# Patient Record
Sex: Male | Born: 1975 | Hispanic: No | Marital: Married | Smoking: Never smoker
Health system: Southern US, Community
[De-identification: ages and names within clinical notes are randomized; demographics above are authoritative.]

## PROBLEM LIST (undated history)

## (undated) DIAGNOSIS — F411 Generalized anxiety disorder: Secondary | ICD-10-CM

## (undated) DIAGNOSIS — K648 Other hemorrhoids: Secondary | ICD-10-CM

## (undated) DIAGNOSIS — M722 Plantar fascial fibromatosis: Secondary | ICD-10-CM

## (undated) DIAGNOSIS — K602 Anal fissure, unspecified: Secondary | ICD-10-CM

## (undated) DIAGNOSIS — R739 Hyperglycemia, unspecified: Secondary | ICD-10-CM

## (undated) DIAGNOSIS — K5080 Crohn's disease of both small and large intestine without complications: Secondary | ICD-10-CM

## (undated) DIAGNOSIS — K449 Diaphragmatic hernia without obstruction or gangrene: Secondary | ICD-10-CM

## (undated) DIAGNOSIS — K219 Gastro-esophageal reflux disease without esophagitis: Secondary | ICD-10-CM

## (undated) DIAGNOSIS — I509 Heart failure, unspecified: Secondary | ICD-10-CM

## (undated) DIAGNOSIS — K589 Irritable bowel syndrome without diarrhea: Secondary | ICD-10-CM

## (undated) DIAGNOSIS — E785 Hyperlipidemia, unspecified: Secondary | ICD-10-CM

## (undated) DIAGNOSIS — J309 Allergic rhinitis, unspecified: Secondary | ICD-10-CM

## (undated) HISTORY — PX: PILONIDAL CYST EXCISION: SHX744

## (undated) HISTORY — DX: Anal fissure, unspecified: K60.2

## (undated) HISTORY — DX: Gastro-esophageal reflux disease without esophagitis: K21.9

## (undated) HISTORY — DX: Plantar fascial fibromatosis: M72.2

## (undated) HISTORY — DX: Hyperlipidemia, unspecified: E78.5

## (undated) HISTORY — DX: Diaphragmatic hernia without obstruction or gangrene: K44.9

## (undated) HISTORY — DX: Allergic rhinitis, unspecified: J30.9

## (undated) HISTORY — DX: Crohn's disease of both small and large intestine without complications: K50.80

## (undated) HISTORY — DX: Hyperglycemia, unspecified: R73.9

## (undated) HISTORY — DX: Irritable bowel syndrome without diarrhea: K58.9

## (undated) HISTORY — DX: Generalized anxiety disorder: F41.1

## (undated) HISTORY — DX: Other hemorrhoids: K64.8

## (undated) HISTORY — DX: Heart failure, unspecified: I50.9

---

## 1997-09-04 DIAGNOSIS — K5080 Crohn's disease of both small and large intestine without complications: Secondary | ICD-10-CM

## 1997-09-04 HISTORY — DX: Crohn's disease of both small and large intestine without complications: K50.80

## 2000-11-16 ENCOUNTER — Encounter: Payer: Self-pay | Admitting: Gastroenterology

## 2000-11-16 ENCOUNTER — Other Ambulatory Visit: Admission: RE | Admit: 2000-11-16 | Discharge: 2000-11-16 | Payer: Self-pay | Admitting: Gastroenterology

## 2000-11-16 DIAGNOSIS — K648 Other hemorrhoids: Secondary | ICD-10-CM | POA: Insufficient documentation

## 2001-10-22 ENCOUNTER — Encounter: Payer: Self-pay | Admitting: Gastroenterology

## 2001-10-22 ENCOUNTER — Ambulatory Visit (HOSPITAL_COMMUNITY): Admission: RE | Admit: 2001-10-22 | Discharge: 2001-10-22 | Payer: Self-pay | Admitting: Gastroenterology

## 2001-10-24 ENCOUNTER — Encounter: Payer: Self-pay | Admitting: Gastroenterology

## 2001-10-24 DIAGNOSIS — K210 Gastro-esophageal reflux disease with esophagitis: Secondary | ICD-10-CM

## 2001-10-24 DIAGNOSIS — K2970 Gastritis, unspecified, without bleeding: Secondary | ICD-10-CM | POA: Insufficient documentation

## 2001-10-24 DIAGNOSIS — K449 Diaphragmatic hernia without obstruction or gangrene: Secondary | ICD-10-CM | POA: Insufficient documentation

## 2001-10-24 DIAGNOSIS — K2990 Gastroduodenitis, unspecified, without bleeding: Secondary | ICD-10-CM

## 2004-08-03 ENCOUNTER — Ambulatory Visit: Payer: Self-pay | Admitting: Internal Medicine

## 2004-12-26 ENCOUNTER — Ambulatory Visit: Payer: Self-pay | Admitting: Internal Medicine

## 2005-01-03 ENCOUNTER — Ambulatory Visit (HOSPITAL_COMMUNITY): Admission: RE | Admit: 2005-01-03 | Discharge: 2005-01-03 | Payer: Self-pay | Admitting: Internal Medicine

## 2005-03-08 ENCOUNTER — Ambulatory Visit: Payer: Self-pay | Admitting: Internal Medicine

## 2005-08-24 ENCOUNTER — Ambulatory Visit: Payer: Self-pay | Admitting: Internal Medicine

## 2005-10-11 ENCOUNTER — Ambulatory Visit: Payer: Self-pay | Admitting: Internal Medicine

## 2005-10-17 ENCOUNTER — Ambulatory Visit: Payer: Self-pay | Admitting: Internal Medicine

## 2005-11-18 ENCOUNTER — Ambulatory Visit: Payer: Self-pay | Admitting: Internal Medicine

## 2006-01-15 ENCOUNTER — Ambulatory Visit: Payer: Self-pay | Admitting: Internal Medicine

## 2006-01-30 ENCOUNTER — Ambulatory Visit: Payer: Self-pay | Admitting: Internal Medicine

## 2006-02-01 ENCOUNTER — Encounter (INDEPENDENT_AMBULATORY_CARE_PROVIDER_SITE_OTHER): Payer: Self-pay | Admitting: Specialist

## 2006-02-01 ENCOUNTER — Ambulatory Visit: Payer: Self-pay | Admitting: Internal Medicine

## 2006-03-19 ENCOUNTER — Ambulatory Visit: Payer: Self-pay | Admitting: Gastroenterology

## 2006-08-01 ENCOUNTER — Ambulatory Visit: Payer: Self-pay | Admitting: Internal Medicine

## 2006-11-26 ENCOUNTER — Ambulatory Visit: Payer: Self-pay | Admitting: Internal Medicine

## 2007-04-03 DIAGNOSIS — K589 Irritable bowel syndrome without diarrhea: Secondary | ICD-10-CM | POA: Insufficient documentation

## 2007-04-03 DIAGNOSIS — K5090 Crohn's disease, unspecified, without complications: Secondary | ICD-10-CM | POA: Insufficient documentation

## 2007-04-03 DIAGNOSIS — K219 Gastro-esophageal reflux disease without esophagitis: Secondary | ICD-10-CM

## 2007-04-03 HISTORY — DX: Gastro-esophageal reflux disease without esophagitis: K21.9

## 2007-04-03 HISTORY — DX: Irritable bowel syndrome, unspecified: K58.9

## 2007-04-05 ENCOUNTER — Ambulatory Visit: Payer: Self-pay | Admitting: Internal Medicine

## 2007-04-06 DIAGNOSIS — J309 Allergic rhinitis, unspecified: Secondary | ICD-10-CM

## 2007-04-06 HISTORY — DX: Allergic rhinitis, unspecified: J30.9

## 2007-04-16 ENCOUNTER — Ambulatory Visit: Payer: Self-pay | Admitting: Internal Medicine

## 2007-04-29 ENCOUNTER — Ambulatory Visit: Payer: Self-pay | Admitting: Internal Medicine

## 2007-05-03 ENCOUNTER — Ambulatory Visit (HOSPITAL_COMMUNITY): Admission: RE | Admit: 2007-05-03 | Discharge: 2007-05-03 | Payer: Self-pay | Admitting: Internal Medicine

## 2007-07-01 ENCOUNTER — Ambulatory Visit: Payer: Self-pay | Admitting: Gastroenterology

## 2007-07-04 DIAGNOSIS — R05 Cough: Secondary | ICD-10-CM

## 2007-07-04 DIAGNOSIS — IMO0002 Reserved for concepts with insufficient information to code with codable children: Secondary | ICD-10-CM | POA: Insufficient documentation

## 2007-07-04 DIAGNOSIS — M545 Low back pain: Secondary | ICD-10-CM

## 2007-08-05 ENCOUNTER — Encounter (INDEPENDENT_AMBULATORY_CARE_PROVIDER_SITE_OTHER): Payer: Self-pay | Admitting: *Deleted

## 2007-10-08 ENCOUNTER — Ambulatory Visit: Payer: Self-pay | Admitting: Internal Medicine

## 2007-10-08 DIAGNOSIS — F411 Generalized anxiety disorder: Secondary | ICD-10-CM

## 2007-10-08 HISTORY — DX: Generalized anxiety disorder: F41.1

## 2007-10-16 ENCOUNTER — Ambulatory Visit: Payer: Self-pay | Admitting: Internal Medicine

## 2007-10-16 ENCOUNTER — Telehealth (INDEPENDENT_AMBULATORY_CARE_PROVIDER_SITE_OTHER): Payer: Self-pay | Admitting: *Deleted

## 2007-10-28 ENCOUNTER — Telehealth (INDEPENDENT_AMBULATORY_CARE_PROVIDER_SITE_OTHER): Payer: Self-pay | Admitting: *Deleted

## 2007-10-31 ENCOUNTER — Encounter: Payer: Self-pay | Admitting: Internal Medicine

## 2007-11-06 ENCOUNTER — Ambulatory Visit: Payer: Self-pay | Admitting: Cardiology

## 2007-12-05 ENCOUNTER — Ambulatory Visit: Payer: Self-pay | Admitting: Internal Medicine

## 2007-12-06 ENCOUNTER — Ambulatory Visit: Payer: Self-pay | Admitting: Internal Medicine

## 2007-12-06 LAB — CONVERTED CEMR LAB
ALT: 46 units/L (ref 0–53)
AST: 27 units/L (ref 0–37)
Albumin: 3.8 g/dL (ref 3.5–5.2)
Alkaline Phosphatase: 72 units/L (ref 39–117)
BUN: 11 mg/dL (ref 6–23)
Bacteria, UA: NEGATIVE
Basophils Absolute: 0 10*3/uL (ref 0.0–0.1)
Basophils Relative: 0.4 % (ref 0.0–1.0)
Bilirubin Urine: NEGATIVE
Bilirubin, Direct: 0.2 mg/dL (ref 0.0–0.3)
CO2: 32 meq/L (ref 19–32)
Calcium: 9.1 mg/dL (ref 8.4–10.5)
Chloride: 107 meq/L (ref 96–112)
Cholesterol: 243 mg/dL (ref 0–200)
Creatinine, Ser: 1 mg/dL (ref 0.4–1.5)
Crystals: NEGATIVE
Direct LDL: 198.6 mg/dL
Eosinophils Absolute: 0.1 10*3/uL (ref 0.0–0.7)
Eosinophils Relative: 1.3 % (ref 0.0–5.0)
GFR calc Af Amer: 113 mL/min
GFR calc non Af Amer: 93 mL/min
Glucose, Bld: 110 mg/dL — ABNORMAL HIGH (ref 70–99)
HCT: 43.8 % (ref 39.0–52.0)
HDL: 34.1 mg/dL — ABNORMAL LOW (ref >39.0)
Hemoglobin, Urine: NEGATIVE
Hemoglobin: 14.4 g/dL (ref 13.0–17.0)
Ketones, ur: NEGATIVE mg/dL
Leukocytes, UA: NEGATIVE
Lymphocytes Relative: 30.8 % (ref 12.0–46.0)
MCHC: 32.8 g/dL (ref 30.0–36.0)
MCV: 80.9 fL (ref 78.0–100.0)
Monocytes Absolute: 0.8 10*3/uL (ref 0.1–1.0)
Monocytes Relative: 10.3 % (ref 3.0–12.0)
Neutro Abs: 4.2 10*3/uL (ref 1.4–7.7)
Neutrophils Relative %: 57.2 % (ref 43.0–77.0)
Nitrite: NEGATIVE
PSA: 1.6 ng/mL (ref 0.10–4.00)
Platelets: 240 10*3/uL (ref 150–400)
Potassium: 4.3 meq/L (ref 3.5–5.1)
RBC: 5.41 M/uL (ref 4.22–5.81)
RDW: 12.7 % (ref 11.5–14.6)
Sodium: 142 meq/L (ref 135–145)
Specific Gravity, Urine: 1.01 (ref 1.000–1.03)
Squamous Epithelial / HPF: NEGATIVE /lpf
TSH: 1.69 microintl units/mL (ref 0.35–5.50)
Total Bilirubin: 0.7 mg/dL (ref 0.3–1.2)
Total CHOL/HDL Ratio: 7.1
Total Protein, Urine: 30 mg/dL — AB
Total Protein: 7.4 g/dL (ref 6.0–8.3)
Triglycerides: 126 mg/dL (ref 0–149)
Urine Glucose: NEGATIVE mg/dL
Urobilinogen, UA: 0.2 (ref 0.0–1.0)
VLDL: 25 mg/dL (ref 0–40)
WBC, UA: NONE SEEN cells/hpf
WBC: 7.4 10*3/uL (ref 4.5–10.5)
pH: 7 (ref 5.0–8.0)

## 2008-01-10 ENCOUNTER — Telehealth: Payer: Self-pay | Admitting: Internal Medicine

## 2008-01-28 ENCOUNTER — Telehealth: Payer: Self-pay | Admitting: Internal Medicine

## 2008-04-09 ENCOUNTER — Ambulatory Visit: Payer: Self-pay | Admitting: Internal Medicine

## 2008-04-09 DIAGNOSIS — E785 Hyperlipidemia, unspecified: Secondary | ICD-10-CM

## 2008-04-09 HISTORY — DX: Hyperlipidemia, unspecified: E78.5

## 2008-04-10 LAB — CONVERTED CEMR LAB
ALT: 48 units/L (ref 0–53)
AST: 27 units/L (ref 0–37)
Albumin: 4.3 g/dL (ref 3.5–5.2)
Alkaline Phosphatase: 73 units/L (ref 39–117)
Bilirubin, Direct: 0.1 mg/dL (ref 0.0–0.3)
Cholesterol: 166 mg/dL (ref 0–200)
Direct LDL: 108.8 mg/dL
HDL: 35.4 mg/dL — ABNORMAL LOW (ref >39.0)
Total Bilirubin: 0.9 mg/dL (ref 0.3–1.2)
Total CHOL/HDL Ratio: 4.7
Total Protein: 8.2 g/dL (ref 6.0–8.3)
Triglycerides: 214 mg/dL (ref 0–149)
VLDL: 43 mg/dL — ABNORMAL HIGH (ref 0–40)

## 2008-10-12 ENCOUNTER — Ambulatory Visit: Payer: Self-pay | Admitting: Endocrinology

## 2009-01-07 ENCOUNTER — Ambulatory Visit: Payer: Self-pay | Admitting: Internal Medicine

## 2009-01-07 LAB — CONVERTED CEMR LAB
ALT: 31 units/L (ref 0–53)
AST: 27 units/L (ref 0–37)
Albumin: 4.1 g/dL (ref 3.5–5.2)
Alkaline Phosphatase: 80 units/L (ref 39–117)
BUN: 10 mg/dL (ref 6–23)
Basophils Absolute: 0.1 10*3/uL (ref 0.0–0.1)
Basophils Relative: 0.7 % (ref 0.0–3.0)
Bilirubin Urine: NEGATIVE
Bilirubin, Direct: 0.1 mg/dL (ref 0.0–0.3)
CO2: 33 meq/L — ABNORMAL HIGH (ref 19–32)
Calcium: 9.3 mg/dL (ref 8.4–10.5)
Chloride: 102 meq/L (ref 96–112)
Cholesterol: 222 mg/dL — ABNORMAL HIGH (ref 0–200)
Creatinine, Ser: 0.9 mg/dL (ref 0.4–1.5)
Direct LDL: 157 mg/dL
Eosinophils Absolute: 0.1 10*3/uL (ref 0.0–0.7)
Eosinophils Relative: 0.9 % (ref 0.0–5.0)
GFR calc non Af Amer: 104.08 mL/min (ref >60)
Glucose, Bld: 81 mg/dL (ref 70–99)
HCT: 43.2 % (ref 39.0–52.0)
HDL: 31.2 mg/dL — ABNORMAL LOW (ref >39.00)
Hemoglobin: 15 g/dL (ref 13.0–17.0)
Ketones, ur: NEGATIVE mg/dL
Leukocytes, UA: NEGATIVE
Lymphocytes Relative: 32.4 % (ref 12.0–46.0)
Lymphs Abs: 2.5 10*3/uL (ref 0.7–4.0)
MCHC: 34.6 g/dL (ref 30.0–36.0)
MCV: 82.8 fL (ref 78.0–100.0)
Monocytes Absolute: 0.5 10*3/uL (ref 0.1–1.0)
Monocytes Relative: 6.8 % (ref 3.0–12.0)
Neutro Abs: 4.4 10*3/uL (ref 1.4–7.7)
Neutrophils Relative %: 59.2 % (ref 43.0–77.0)
Nitrite: NEGATIVE
Platelets: 237 10*3/uL (ref 150.0–400.0)
Potassium: 4.1 meq/L (ref 3.5–5.1)
RBC: 5.22 M/uL (ref 4.22–5.81)
RDW: 12.1 % (ref 11.5–14.6)
Sodium: 141 meq/L (ref 135–145)
Specific Gravity, Urine: 1.01 (ref 1.000–1.030)
TSH: 1.28 microintl units/mL (ref 0.35–5.50)
Total Bilirubin: 0.8 mg/dL (ref 0.3–1.2)
Total CHOL/HDL Ratio: 7
Total Protein, Urine: NEGATIVE mg/dL
Total Protein: 7.8 g/dL (ref 6.0–8.3)
Triglycerides: 285 mg/dL — ABNORMAL HIGH (ref 0.0–149.0)
Urine Glucose: NEGATIVE mg/dL
Urobilinogen, UA: 0.2 (ref 0.0–1.0)
VLDL: 57 mg/dL — ABNORMAL HIGH (ref 0.0–40.0)
WBC: 7.6 10*3/uL (ref 4.5–10.5)
pH: 5.5 (ref 5.0–8.0)

## 2009-02-11 ENCOUNTER — Ambulatory Visit: Payer: Self-pay | Admitting: Internal Medicine

## 2009-02-11 ENCOUNTER — Telehealth: Payer: Self-pay | Admitting: Internal Medicine

## 2009-02-11 DIAGNOSIS — S43006A Unspecified dislocation of unspecified shoulder joint, initial encounter: Secondary | ICD-10-CM | POA: Insufficient documentation

## 2009-02-11 DIAGNOSIS — E8881 Metabolic syndrome: Secondary | ICD-10-CM | POA: Insufficient documentation

## 2009-02-11 LAB — CONVERTED CEMR LAB
Cholesterol, target level: 200 mg/dL
HDL goal, serum: 40 mg/dL
LDL Goal: 160 mg/dL

## 2009-07-16 ENCOUNTER — Ambulatory Visit: Payer: Self-pay | Admitting: Internal Medicine

## 2009-07-16 DIAGNOSIS — K921 Melena: Secondary | ICD-10-CM | POA: Insufficient documentation

## 2009-07-27 ENCOUNTER — Encounter (INDEPENDENT_AMBULATORY_CARE_PROVIDER_SITE_OTHER): Payer: Self-pay | Admitting: *Deleted

## 2009-07-27 ENCOUNTER — Ambulatory Visit: Payer: Self-pay | Admitting: Gastroenterology

## 2009-07-27 DIAGNOSIS — K6289 Other specified diseases of anus and rectum: Secondary | ICD-10-CM

## 2009-07-27 DIAGNOSIS — K602 Anal fissure, unspecified: Secondary | ICD-10-CM | POA: Insufficient documentation

## 2009-08-25 ENCOUNTER — Ambulatory Visit: Payer: Self-pay | Admitting: Gastroenterology

## 2009-08-25 LAB — HM COLONOSCOPY

## 2009-08-26 ENCOUNTER — Ambulatory Visit: Payer: Self-pay | Admitting: Internal Medicine

## 2009-08-26 ENCOUNTER — Encounter: Payer: Self-pay | Admitting: Gastroenterology

## 2009-08-26 DIAGNOSIS — H109 Unspecified conjunctivitis: Secondary | ICD-10-CM | POA: Insufficient documentation

## 2009-08-26 DIAGNOSIS — M549 Dorsalgia, unspecified: Secondary | ICD-10-CM | POA: Insufficient documentation

## 2009-10-01 ENCOUNTER — Ambulatory Visit: Payer: Self-pay | Admitting: Internal Medicine

## 2009-10-14 ENCOUNTER — Ambulatory Visit: Payer: Self-pay | Admitting: Internal Medicine

## 2009-10-19 ENCOUNTER — Ambulatory Visit: Payer: Self-pay | Admitting: Gastroenterology

## 2009-10-19 DIAGNOSIS — K5080 Crohn's disease of both small and large intestine without complications: Secondary | ICD-10-CM | POA: Insufficient documentation

## 2009-11-13 IMAGING — CT CT PARANASAL SINUSES LIMITED
1 of 2 series · 16 of 25 positions shown, 20 images · non-contrast
Comparison: No priors for comparison.

CLINICAL DATA: 31-year-old with chronic cough and sore throat. 
LIMITED CT OF PARANASAL SINUSES:
TECHNIQUE: Limited coronal CT images were obtained through the paranasal sinuses without intravenous contrast.

[Series 4: ltd sinus 3.0 h30s · axial · 0.29mm/px · z∈[-104,-6]mm · 16 of 24 slices shown, 20 images]
[im 2/24  brain]
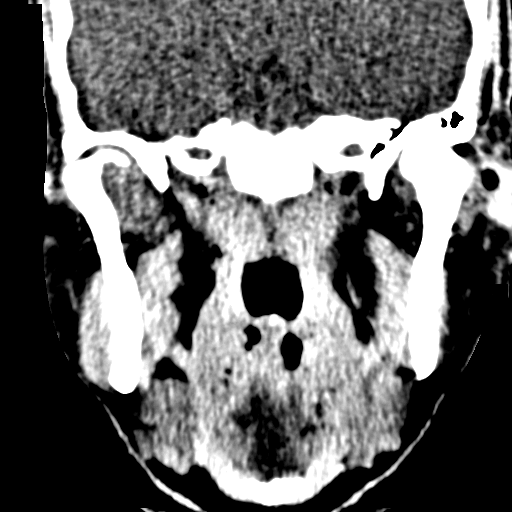
[im 2/24  bone]
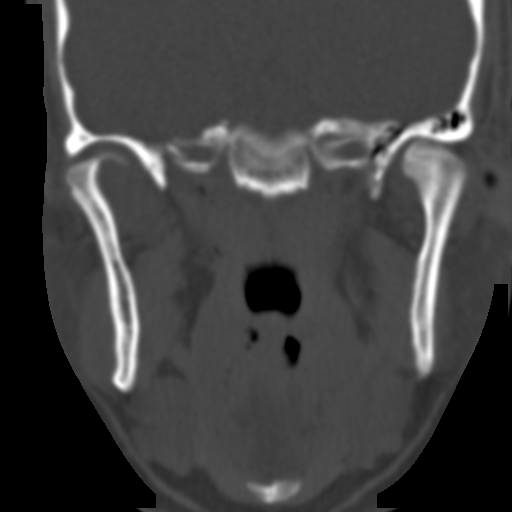
[im 4/24  bone]
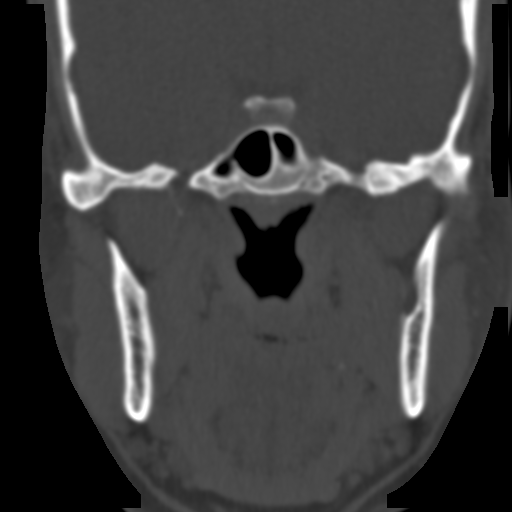
[im 5/24  bone]
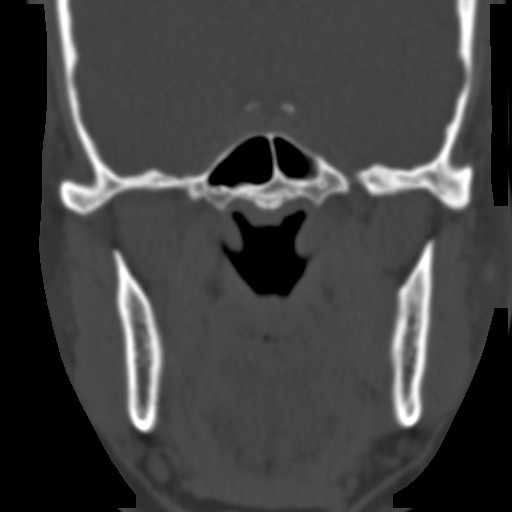
[im 6/24  bone]
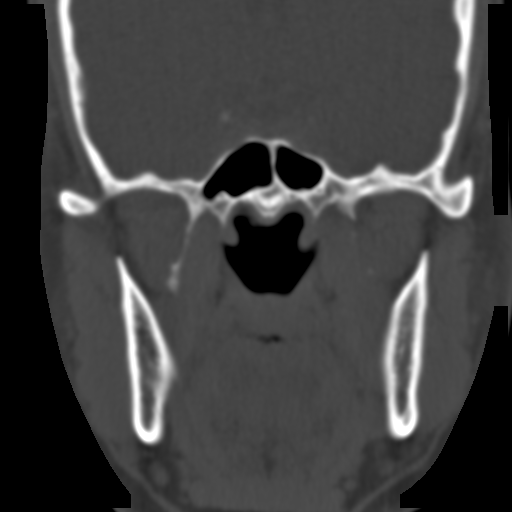
[im 8/24  brain]
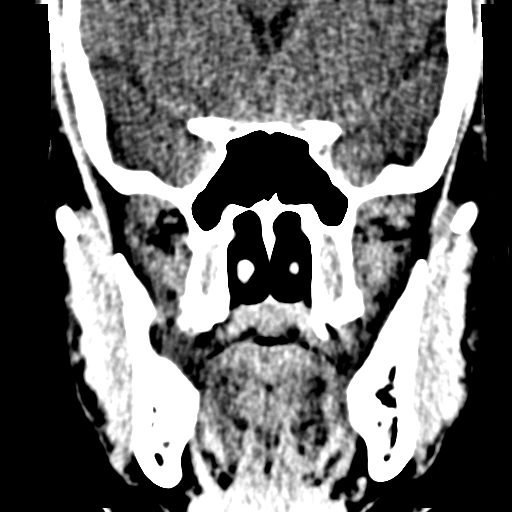
[im 8/24  bone]
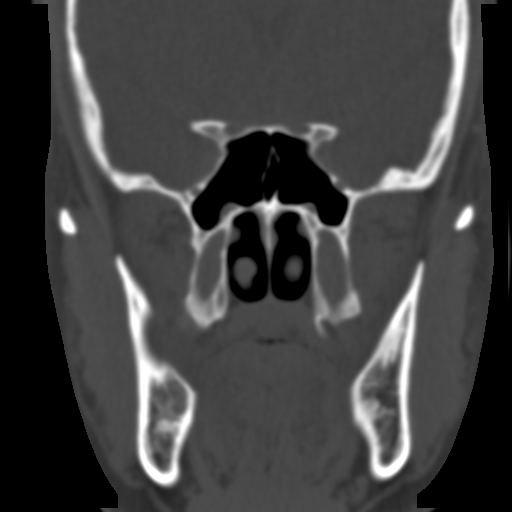
[im 9/24  bone]
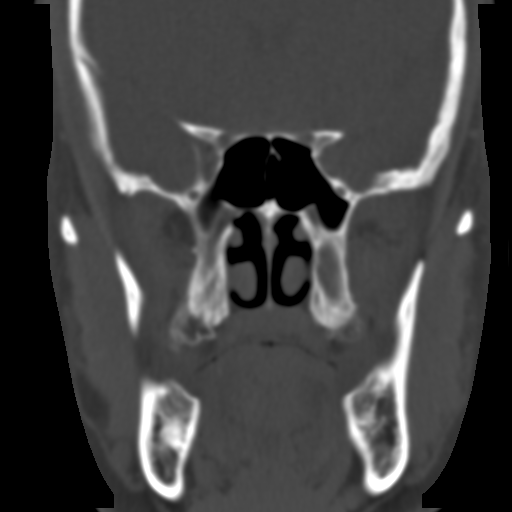
[im 10/24  bone]
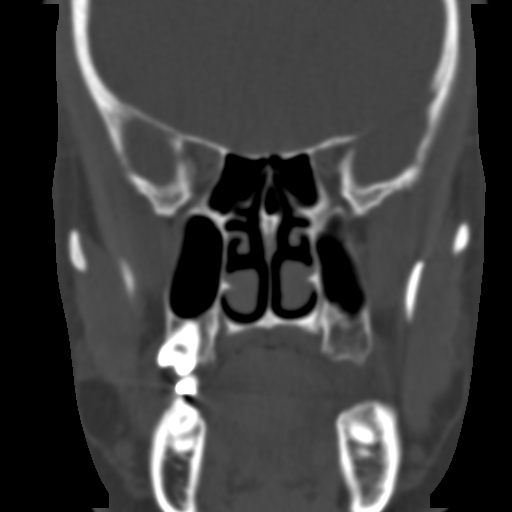
[im 12/24  bone]
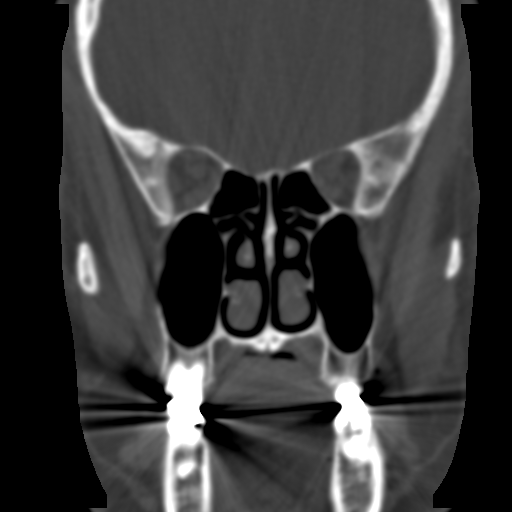
[im 13/24  brain]
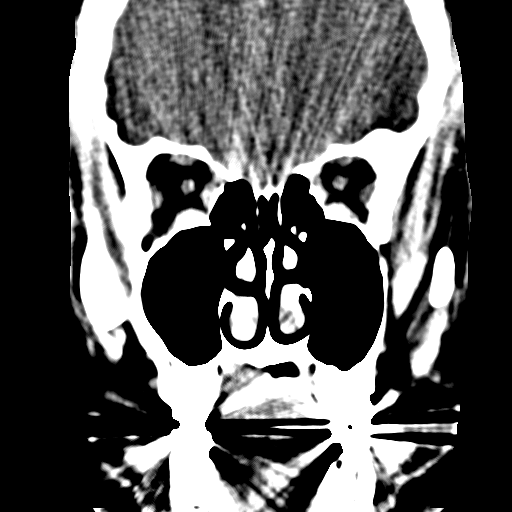
[im 13/24  bone]
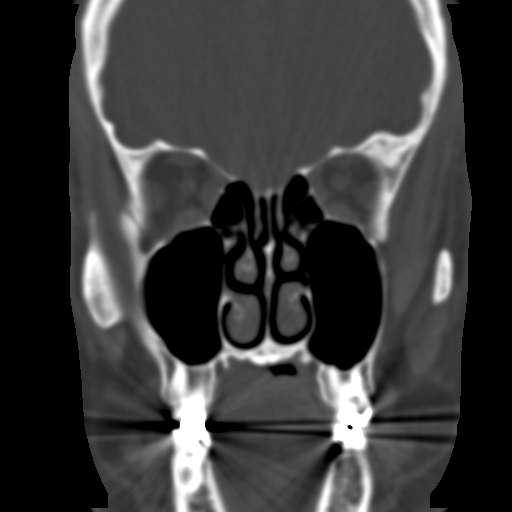
[im 15/24  bone]
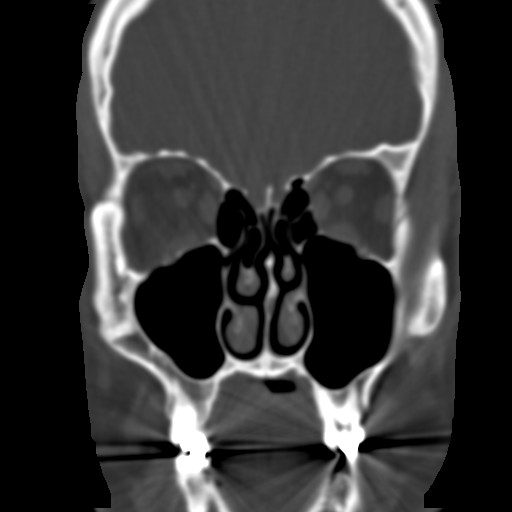
[im 16/24  bone]
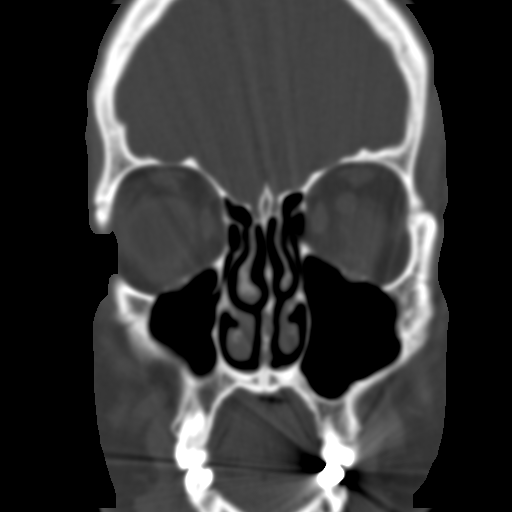
[im 17/24  bone]
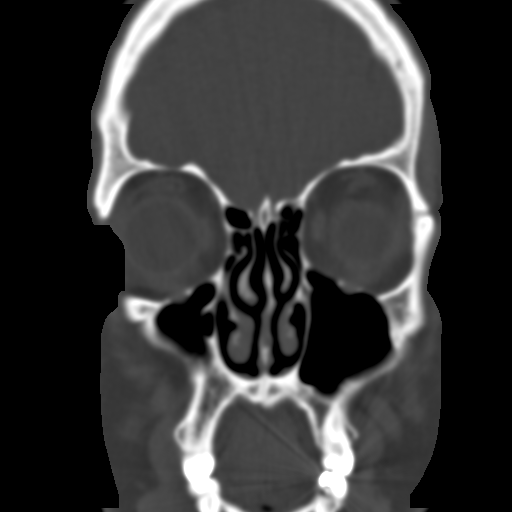
[im 19/24  brain]
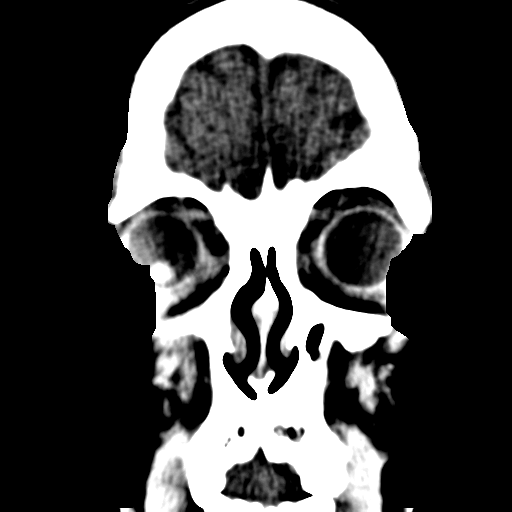
[im 19/24  bone]
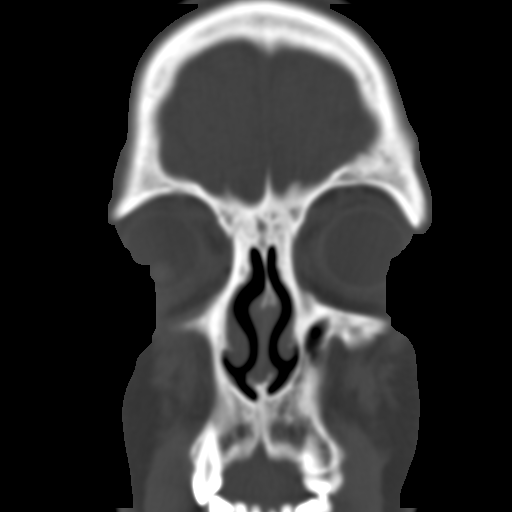
[im 20/24  bone]
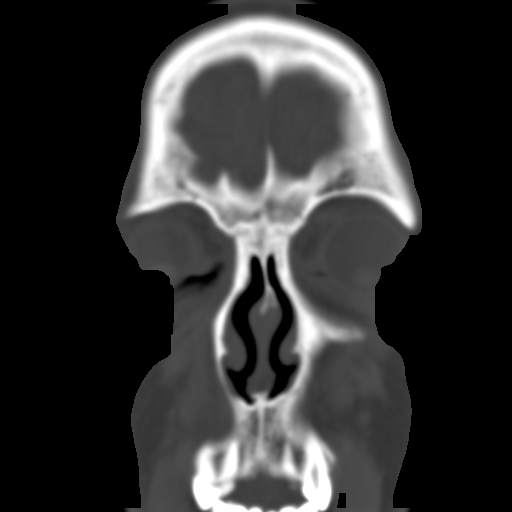
[im 21/24  bone]
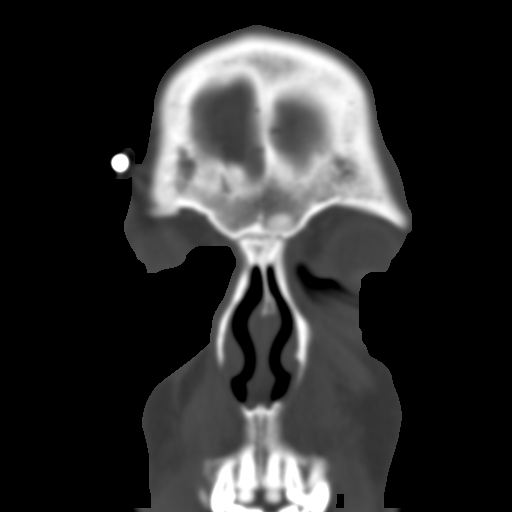
[im 23/24  bone]
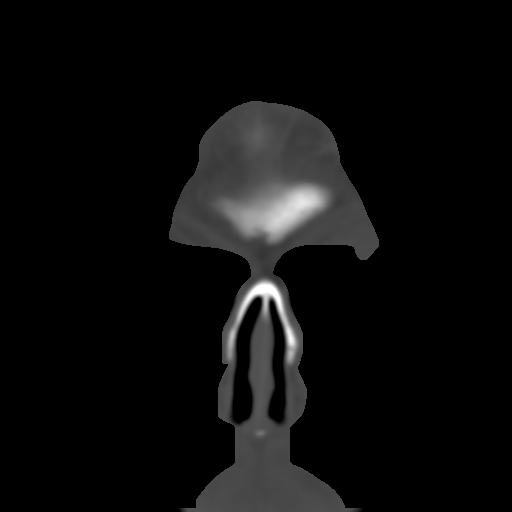

[16 of 25 positions shown; findings below may reference images not displayed]

FINDINGS: The sphenoid, maxillary, and ethmoid sinuses are clear.  The ostiomeatal complex is patent bilaterally.  The frontal sinuses are not developed in this patient, a congenital variant.  There are no findings to suggest acute or chronic sinusitis.  The orbital soft tissues are unremarkable on this noncontrast study.
IMPRESSION: Negative for acute or chronic sinusitis.

## 2010-01-20 ENCOUNTER — Ambulatory Visit: Payer: Self-pay | Admitting: Internal Medicine

## 2010-01-20 LAB — CONVERTED CEMR LAB
ALT: 24 units/L (ref 0–53)
AST: 18 units/L (ref 0–37)
Albumin: 4 g/dL (ref 3.5–5.2)
Alkaline Phosphatase: 73 units/L (ref 39–117)
BUN: 11 mg/dL (ref 6–23)
Basophils Absolute: 0 10*3/uL (ref 0.0–0.1)
Basophils Relative: 0.3 % (ref 0.0–3.0)
Bilirubin Urine: NEGATIVE
Bilirubin, Direct: 0.1 mg/dL (ref 0.0–0.3)
CO2: 30 meq/L (ref 19–32)
Calcium: 9.2 mg/dL (ref 8.4–10.5)
Chloride: 104 meq/L (ref 96–112)
Cholesterol: 162 mg/dL (ref 0–200)
Creatinine, Ser: 0.9 mg/dL (ref 0.4–1.5)
Eosinophils Absolute: 0.1 10*3/uL (ref 0.0–0.7)
Eosinophils Relative: 1.3 % (ref 0.0–5.0)
GFR calc non Af Amer: 103.4 mL/min (ref >60)
Glucose, Bld: 94 mg/dL (ref 70–99)
HCT: 42.3 % (ref 39.0–52.0)
HDL: 37.1 mg/dL — ABNORMAL LOW (ref >39.00)
Hemoglobin: 14.7 g/dL (ref 13.0–17.0)
Ketones, ur: NEGATIVE mg/dL
LDL Cholesterol: 92 mg/dL (ref 0–99)
Leukocytes, UA: NEGATIVE
Lymphocytes Relative: 25.4 % (ref 12.0–46.0)
Lymphs Abs: 2.7 10*3/uL (ref 0.7–4.0)
MCHC: 34.7 g/dL (ref 30.0–36.0)
MCV: 81.9 fL (ref 78.0–100.0)
Monocytes Absolute: 0.6 10*3/uL (ref 0.1–1.0)
Monocytes Relative: 5.4 % (ref 3.0–12.0)
Neutro Abs: 7.3 10*3/uL (ref 1.4–7.7)
Neutrophils Relative %: 67.6 % (ref 43.0–77.0)
Nitrite: NEGATIVE
Platelets: 286 10*3/uL (ref 150.0–400.0)
Potassium: 4.4 meq/L (ref 3.5–5.1)
RBC: 5.17 M/uL (ref 4.22–5.81)
RDW: 13.7 % (ref 11.5–14.6)
Sodium: 140 meq/L (ref 135–145)
Specific Gravity, Urine: 1.02 (ref 1.000–1.030)
TSH: 1.29 microintl units/mL (ref 0.35–5.50)
Total Bilirubin: 0.6 mg/dL (ref 0.3–1.2)
Total CHOL/HDL Ratio: 4
Total Protein, Urine: NEGATIVE mg/dL
Total Protein: 7.5 g/dL (ref 6.0–8.3)
Triglycerides: 166 mg/dL — ABNORMAL HIGH (ref 0.0–149.0)
Urine Glucose: NEGATIVE mg/dL
Urobilinogen, UA: 0.2 (ref 0.0–1.0)
VLDL: 33.2 mg/dL (ref 0.0–40.0)
WBC: 10.8 10*3/uL — ABNORMAL HIGH (ref 4.5–10.5)
pH: 6 (ref 5.0–8.0)

## 2010-01-25 ENCOUNTER — Telehealth: Payer: Self-pay | Admitting: Internal Medicine

## 2010-05-12 ENCOUNTER — Ambulatory Visit: Payer: Self-pay | Admitting: Internal Medicine

## 2010-05-12 DIAGNOSIS — M722 Plantar fascial fibromatosis: Secondary | ICD-10-CM

## 2010-05-13 LAB — CONVERTED CEMR LAB
Direct LDL: 175.9 mg/dL
HDL: 34.6 mg/dL — ABNORMAL LOW (ref >39.00)
Triglycerides: 455 mg/dL — ABNORMAL HIGH (ref 0.0–149.0)

## 2010-06-09 ENCOUNTER — Telehealth: Payer: Self-pay | Admitting: Internal Medicine

## 2010-10-04 NOTE — Assessment & Plan Note (Signed)
Summary: CPX/ NWS #   Vital Signs:  Patient profile:   35 year old male Height:      69 inches Weight:      200.38 pounds BMI:     29.70 O2 Sat:      97 % on Room air Temp:     98.3 degrees F oral Pulse rate:   87 / minute BP sitting:   100 / 72  (left arm) Cuff size:   large  Vitals Entered ByZella Ball Ewing (Jan 20, 2010 8:41 AM)  O2 Flow:  Room air  CC: Adult Physical/RE   Primary Care Provider:  Oliver Barre, MD  CC:  Adult Physical/RE.  History of Present Illness: trying to cut back on red meat;  nsaid works well for the  back pain,  does not need the methocarbamol;  overall good complliance with emds;  right shoulder displacement from one yr ago improved - saw ortho, then PT now better but only 95% but not activity limiting;  Pt denies CP, sob, doe, wheezing, orthopnea, pnd, worsening LE edema, palps, dizziness or syncope  Pt denies new neuro symptoms such as headache, facial or extremity weakness   Problems Prior to Update: 1)  Crohn's Disease-large & Small Intestine  (ICD-555.2) 2)  Back Pain  (ICD-724.5) 3)  Conjunctivitis, Bilateral  (ICD-372.30) 4)  Anal Fissure  (ICD-565.0) 5)  Anal or Rectal Pain  (ICD-569.42) 6)  Hematochezia  (ICD-578.1) 7)  Dysmetabolic Syndrome  (ICD-277.7) 8)  Shoulder Dislocation  (ICD-831.00) 9)  Preventive Health Care  (ICD-V70.0) 10)  Hepatotoxicity, Drug-induced, Risk of  (ICD-V58.69) 11)  Hyperlipidemia  (ICD-272.4) 12)  Gerd  (ICD-530.81) 13)  Preventive Health Care  (ICD-V70.0) 14)  Internal Hemorrhoids  (ICD-455.0) 15)  Esophagitis, Reflux  (ICD-530.11) 16)  Hiatal Hernia  (ICD-553.3) 17)  Gastritis  (ICD-535.50) 18)  Anxiety  (ICD-300.00) 19)  Low Back Pain  (ICD-724.2) 20)  Herniated Disc  (ICD-722.2) 21)  Hx of Low Back Pain, Chronic  (ICD-724.2) 22)  Cough, Chronic  (ICD-786.2) 23)  Ibs  (ICD-564.1) 24)  Crohn's Disease  (ICD-555.9) 25)  Allergic Rhinitis  (ICD-477.9) 26)  Gerd  (ICD-530.81)  Medications Prior to  Update: 1)  Zyrtec Allergy 10 Mg Tabs (Cetirizine Hcl) .... Take 1 Tablet By Mouth Once A Day As Needed 2)  Simvastatin 40 Mg Tabs (Simvastatin) .... Once Daily 3)  Entocort Ec 3 Mg  Cp24 (Budesonide) .... Take 3 Capsules Daily 4)  Clarithromycin 500 Mg Tabs (Clarithromycin) .Marland Kitchen.. 1 By Mouth Two Times A Day 5)  Methocarbamol 750 Mg Tabs (Methocarbamol) .Marland Kitchen.. 1 By Mouth Two Times A Day As Needed 6)  Diclofenac Sodium 75 Mg Tbec (Diclofenac Sodium) .Marland Kitchen.. 1 By Mouth Two Times A Day As Needed Pain 7)  Tussionex Pennkinetic Er 8-10 Mg/69ml Lqcr (Chlorpheniramine-Hydrocodone) .Marland Kitchen.. 1 Tsp By Mouth Two Times A Day As Needed 8)  Proair Hfa 108 (90 Base) Mcg/act Aers (Albuterol Sulfate) .... 2 Puffs Four Times Per Day As Needed 9)  Protonix 40 Mg Tbec (Pantoprazole Sodium) .... One Tablet By Mouth Once Daily  Current Medications (verified): 1)  Zyrtec Allergy 10 Mg Tabs (Cetirizine Hcl) .... Take 1 Tablet By Mouth Once A Day As Needed 2)  Simvastatin 40 Mg Tabs (Simvastatin) .... Once Daily 3)  Entocort Ec 3 Mg  Cp24 (Budesonide) .... Take 3 Capsules Daily "as Needed" 4)  Diclofenac Sodium 75 Mg Tbec (Diclofenac Sodium) .Marland Kitchen.. 1 By Mouth Two Times A Day As Needed Pain 5)  Protonix 40 Mg Tbec (Pantoprazole Sodium) .... One Tablet By Mouth Once Daily  Allergies (verified): 1)  ! Advil 2)  * Advair  Past History:  Past Medical History: Last updated: 10/19/2009 GERD with LPR Crohns ileocolitis, Dx in 2002 Allergic rhinitis IBS Low back pain chronic cough Anxiety Hyperlipidemia  Past Surgical History: Last updated: 10/08/2007 Denies surgical history  Family History: Last updated: 07/27/2009 father with heart disease No FH of Colon Cancer:  Social History: Last updated: 02/11/2009 Former Smoker quit over 6 mo, minimum now Alcohol use-no Married 1 child Drug use-no Regular exercise-yes  Risk Factors: Exercise: yes (02/11/2009)  Risk Factors: Smoking Status: quit  (10/08/2007)  Family History: Reviewed history from 07/27/2009 and no changes required. father with heart disease No FH of Colon Cancer:  Social History: Reviewed history from 02/11/2009 and no changes required. Former Smoker quit over 6 mo, minimum now Alcohol use-no Married 1 child Drug use-no Regular exercise-yes  Review of Systems  The patient denies anorexia, fever, vision loss, decreased hearing, hoarseness, chest pain, syncope, dyspnea on exertion, peripheral edema, prolonged cough, headaches, hemoptysis, abdominal pain, melena, hematochezia, severe indigestion/heartburn, hematuria, muscle weakness, suspicious skin lesions, transient blindness, difficulty walking, depression, unusual weight change, abnormal bleeding, enlarged lymph nodes, angioedema, and testicular masses.         all otherwise negative per pt -    Physical Exam  General:  alert and overweight-appearing.   Head:  normocephalic and atraumatic.   Eyes:  vision grossly intact, pupils equal, and pupils round.   Ears:  R ear normal and L ear normal.   Nose:  no external deformity and no nasal discharge.   Mouth:  no gingival abnormalities and pharynx pink and moist.   Neck:  supple and no masses.   Lungs:  normal respiratory effort and normal breath sounds.   Heart:  normal rate and regular rhythm.   Abdomen:  soft, non-tender, and normal bowel sounds.   Msk:  no joint tenderness and no joint swelling.   Extremities:  no edema, no erythema  Neurologic:  cranial nerves II-XII intact and strength normal in all extremities.     Impression & Recommendations:  Problem # 1:  Preventive Health Care (ICD-V70.0) Overall doing well, age appropriate education and counseling updated and referral for appropriate preventive services done unless declined, immunizations up to date or declined, diet counseling done if overweight, urged to quit smoking if smokes , most recent labs reviewed and current ordered if  appropriate, ecg reviewed or declined (interpretation per ECG scanned in the EMR if done); information regarding Medicare Prevention requirements given if appropriate; speciality referrals updated as appropriate   Complete Medication List: 1)  Zyrtec Allergy 10 Mg Tabs (Cetirizine hcl) .... Take 1 tablet by mouth once a day as needed 2)  Simvastatin 40 Mg Tabs (Simvastatin) .... Once daily 3)  Entocort Ec 3 Mg Cp24 (Budesonide) .... Take 3 capsules daily "as needed" 4)  Diclofenac Sodium 75 Mg Tbec (Diclofenac sodium) .Marland Kitchen.. 1 by mouth two times a day as needed pain 5)  Protonix 40 Mg Tbec (Pantoprazole sodium) .... One tablet by mouth once daily  Patient Instructions: 1)  Continue all previous medications as before this visit  2)  please call the number on the blue card for the blood work results 3)  Please schedule a follow-up appointment in 1 year or sooner if needed

## 2010-10-04 NOTE — Progress Notes (Signed)
  Phone Note Other Incoming   Caller: pt  Summary of Call: pt wants to know if he should still be taking his simvastatin. Since his last blood work was all good. Pt states he would like to try and control cholesterol with diet and excercise. What do you suggest ? Initial call taken by: Ami Bullins CMA,  Jan 25, 2010 11:41 AM  Follow-up for Phone Call        it is ok to try, but I think in his case it may go back up;  if he wants to try, he can hold the simvastatin, and re-check Lipids only in 4 wks  - 272.0 Follow-up by: Corwin Levins MD,  Jan 25, 2010 2:06 PM  Additional Follow-up for Phone Call Additional follow up Details #1::        LABS SCHEDULED 02/21/2010,  left message on machine for pt to return my call. Margaret Pyle, CMA  Jan 25, 2010 2:41 PM  left message on machine for pt to return my call     Additional Follow-up for Phone Call Additional follow up Details #2::    left message on machine for pt to return my call, closing phone note until further contact from pt Follow-up by: Margaret Pyle, CMA,  Jan 26, 2010 3:45 PM

## 2010-10-04 NOTE — Assessment & Plan Note (Signed)
Summary: sore throat/#/cd   Vital Signs:  Patient profile:   35 year old male Height:      69 inches (175.26 cm) Weight:      196.50 pounds (89.32 kg) BMI:     29.12 O2 Sat:      97 % on Room air Temp:     97.6 degrees F (36.44 degrees C) oral Pulse rate:   93 / minute BP sitting:   102 / 78  (left arm) Cuff size:   large  Vitals Entered ByZella Ball Ewing (October 14, 2009 3:36 PM)  O2 Flow:  Room air CC: pt here to follow up with sore throat/finished levaquin with no improvement/re   Primary Care Provider:  Oliver Barre, MD  CC:  pt here to follow up with sore throat/finished levaquin with no improvement/re.  History of Present Illness: here with severe ST , better then worse again since last visit, and not better with prednisone course rx by am MD assoc with his work;  has some ant neck soreness and low grade temp, but not assoc with significant worsening sinus or ear pain, other neck stiffness, and Pt denies CP, sob, doe, wheezing, orthopnea, pnd, worsening LE edema, palps, dizziness or syncope .  No one else sick at home, but has been working overtime on several work projects (just finished today finally), with increased fatigue and exhaustion, and mult sick persons to whom he was exposed with URI type illness.  Has marked cough at night that seems to cause fitful and unrefreshing sleep.  No OSA symtpoms.   Denies worsening GI symtpoms such as incresaed pain or bowel frequency.  s/p colonoscopy 08/2009 as per EMR.  Denies worsening nasal allergy symtpoms such as congestion or post nasal gtt and no improvement with prednisone..  Problems Prior to Update: 1)  Pharyngitis-acute  (ICD-462) 2)  Back Pain  (ICD-724.5) 3)  Conjunctivitis, Bilateral  (ICD-372.30) 4)  Anal Fissure  (ICD-565.0) 5)  Anal or Rectal Pain  (ICD-569.42) 6)  Hematochezia  (ICD-578.1) 7)  Dysmetabolic Syndrome  (ICD-277.7) 8)  Shoulder Dislocation  (ICD-831.00) 9)  Preventive Health Care  (ICD-V70.0) 10)   Hepatotoxicity, Drug-induced, Risk of  (ICD-V58.69) 11)  Hyperlipidemia  (ICD-272.4) 12)  Gerd  (ICD-530.81) 13)  Preventive Health Care  (ICD-V70.0) 14)  Internal Hemorrhoids  (ICD-455.0) 15)  Esophagitis, Reflux  (ICD-530.11) 16)  Hiatal Hernia  (ICD-553.3) 17)  Gastritis  (ICD-535.50) 18)  Anxiety  (ICD-300.00) 19)  Low Back Pain  (ICD-724.2) 20)  Herniated Disc  (ICD-722.2) 21)  Hx of Low Back Pain, Chronic  (ICD-724.2) 22)  Cough, Chronic  (ICD-786.2) 23)  Ibs  (ICD-564.1) 24)  Crohn's Disease  (ICD-555.9) 25)  Allergic Rhinitis  (ICD-477.9) 26)  Gerd  (ICD-530.81)  Medications Prior to Update: 1)  Zyrtec Allergy 10 Mg Tabs (Cetirizine Hcl) .... Take 1 Tablet By Mouth Once A Day As Needed 2)  Simvastatin 40 Mg Tabs (Simvastatin) .... Once Daily 3)  Tramadol Hcl 50 Mg Tabs (Tramadol Hcl) .Marland Kitchen.. 1-2 By Mouth Qid As Needed For Pain 4)  Anusol-Hc 25 Mg Supp (Hydrocortisone Acetate) .Marland Kitchen.. 1 Supp Two Times A Day For 10 Days 5)  Prednisone 10 Mg Tabs (Prednisone) .Marland Kitchen.. 1 By Mouth Two Times A Day For 5 Days 6)  Diltiazem Cream 2 % .... Apply To Rectal Area Three Times A Day 7)  Entocort Ec 3 Mg  Cp24 (Budesonide) .... Take 3 Capsules Daily 8)  Entocort Ec 3 Mg  Cp24 (Budesonide) .Marland KitchenMarland KitchenMarland Kitchen  Take 3 Capsules Daily 9)  Levaquin 500 Mg Tabs (Levofloxacin) .Marland Kitchen.. 1 By Mouth Once Daily 10)  Methocarbamol 750 Mg Tabs (Methocarbamol) .Marland Kitchen.. 1 By Mouth Two Times A Day As Needed 11)  Diclofenac Sodium 75 Mg Tbec (Diclofenac Sodium) .Marland Kitchen.. 1 By Mouth Two Times A Day As Needed Pain  Current Medications (verified): 1)  Zyrtec Allergy 10 Mg Tabs (Cetirizine Hcl) .... Take 1 Tablet By Mouth Once A Day As Needed 2)  Simvastatin 40 Mg Tabs (Simvastatin) .... Once Daily 3)  Tramadol Hcl 50 Mg Tabs (Tramadol Hcl) .Marland Kitchen.. 1-2 By Mouth Qid As Needed For Pain 4)  Anusol-Hc 25 Mg Supp (Hydrocortisone Acetate) .Marland Kitchen.. 1 Supp Two Times A Day For 10 Days 5)  Diltiazem Cream 2 % .... Apply To Rectal Area Three Times A Day 6)   Entocort Ec 3 Mg  Cp24 (Budesonide) .... Take 3 Capsules Daily 7)  Entocort Ec 3 Mg  Cp24 (Budesonide) .... Take 3 Capsules Daily 8)  Clarithromycin 500 Mg Tabs (Clarithromycin) .Marland Kitchen.. 1 By Mouth Two Times A Day 9)  Methocarbamol 750 Mg Tabs (Methocarbamol) .Marland Kitchen.. 1 By Mouth Two Times A Day As Needed 10)  Diclofenac Sodium 75 Mg Tbec (Diclofenac Sodium) .Marland Kitchen.. 1 By Mouth Two Times A Day As Needed Pain 11)  Tussionex Pennkinetic Er 8-10 Mg/57ml Lqcr (Chlorpheniramine-Hydrocodone) .Marland Kitchen.. 1 Tsp By Mouth Two Times A Day As Needed 12)  Proair Hfa 108 (90 Base) Mcg/act Aers (Albuterol Sulfate) .... 2 Puffs Four Times Per Day As Needed  Allergies (verified): 1)  ! Advil 2)  * Advair  Past History:  Past Surgical History: Last updated: 10/08/2007 Denies surgical history  Social History: Last updated: 02/11/2009 Former Smoker quit over 6 mo, minimum now Alcohol use-no Married 1 child Drug use-no Regular exercise-yes  Risk Factors: Exercise: yes (02/11/2009)  Risk Factors: Smoking Status: quit (10/08/2007)  Past Medical History: GERD Crohns disease not active last 3 yr Dx in 2002 Allergic rhinitis IBS Low back pain chronic cough Anxiety Hyperlipidemia  Review of Systems       all otherwise negative per pt -  Physical Exam  General:  alert and overweight-appearing., mild ill  Head:  normocephalic and atraumatic.   Eyes:  vision grossly intact, pupils equal, and pupils round.   Ears:  bilat tm's mild red, sinus nontender Nose:  no external deformity and no nasal discharge.   Mouth:  pharyngeal erythema and fair dentition.   Neck:  supple and cervical lymphadenopathy with tenderness Lungs:  normal respiratory effort and normal breath sounds.   Heart:  normal rate and regular rhythm.   Extremities:  no edema, no erythema    Impression & Recommendations:  Problem # 1:  PHARYNGITIS-ACUTE (ICD-462)  His updated medication list for this problem includes:    Clarithromycin 500  Mg Tabs (Clarithromycin) .Marland Kitchen... 1 by mouth two times a day    Diclofenac Sodium 75 Mg Tbec (Diclofenac sodium) .Marland Kitchen... 1 by mouth two times a day as needed pain severe recurrent - cant r/o strep - treat as above, f/u any worsening signs or symptoms , also gave cough med for cough at night  Problem # 2:  ALLERGIC RHINITIS (ICD-477.9)  His updated medication list for this problem includes:    Zyrtec Allergy 10 Mg Tabs (Cetirizine hcl) .Marland Kitchen... Take 1 tablet by mouth once a day as needed stable overall by hx and exam, ok to continue meds/tx as is   Problem # 3:  CROHN'S DISEASE (ICD-555.9) stable overall  by hx and exam, ok to continue meds/tx as is   Problem # 4:  ANXIETY (ICD-300.00) mild, chronic with increased recent stressors, but overall stable overall by hx and exam, ok to continue meds/tx as is - does not require new meds  Complete Medication List: 1)  Zyrtec Allergy 10 Mg Tabs (Cetirizine hcl) .... Take 1 tablet by mouth once a day as needed 2)  Simvastatin 40 Mg Tabs (Simvastatin) .... Once daily 3)  Tramadol Hcl 50 Mg Tabs (Tramadol hcl) .Marland Kitchen.. 1-2 by mouth qid as needed for pain 4)  Anusol-hc 25 Mg Supp (Hydrocortisone acetate) .Marland Kitchen.. 1 supp two times a day for 10 days 5)  Diltiazem Cream 2 %  .... Apply to rectal area three times a day 6)  Entocort Ec 3 Mg Cp24 (Budesonide) .... Take 3 capsules daily 7)  Entocort Ec 3 Mg Cp24 (Budesonide) .... Take 3 capsules daily 8)  Clarithromycin 500 Mg Tabs (Clarithromycin) .Marland Kitchen.. 1 by mouth two times a day 9)  Methocarbamol 750 Mg Tabs (Methocarbamol) .Marland Kitchen.. 1 by mouth two times a day as needed 10)  Diclofenac Sodium 75 Mg Tbec (Diclofenac sodium) .Marland Kitchen.. 1 by mouth two times a day as needed pain 11)  Tussionex Pennkinetic Er 8-10 Mg/81ml Lqcr (Chlorpheniramine-hydrocodone) .Marland Kitchen.. 1 tsp by mouth two times a day as needed 12)  Proair Hfa 108 (90 Base) Mcg/act Aers (Albuterol sulfate) .... 2 puffs four times per day as needed  Patient Instructions: 1)  Please  take all new medications as prescribed  2)  Continue all previous medications as before this visit  3)  Please schedule a follow-up appointment in 3 months with CPX labs Prescriptions: PROAIR HFA 108 (90 BASE) MCG/ACT AERS (ALBUTEROL SULFATE) 2 puffs four times per day as needed  #1 x 11   Entered and Authorized by:   Corwin Levins MD   Signed by:   Corwin Levins MD on 10/14/2009   Method used:   Print then Give to Patient   RxID:   [id number] QMVHQIONG PENNKINETIC ER 8-10 MG/5ML LQCR (CHLORPHENIRAMINE-HYDROCODONE) 1 tsp by mouth two times a day as needed  #6 oz x 1   Entered and Authorized by:   Corwin Levins MD   Signed by:   Corwin Levins MD on 10/14/2009   Method used:   Print then Give to Patient   RxID:   [id number] CLARITHROMYCIN 500 MG TABS (CLARITHROMYCIN) 1 by mouth two times a day  #20 x 0   Entered and Authorized by:   Corwin Levins MD   Signed by:   Corwin Levins MD on 10/14/2009   Method used:   Print then Give to Patient   RxID:   [id number]

## 2010-10-04 NOTE — Assessment & Plan Note (Signed)
Summary: foot pain/cd   Vital Signs:  Patient profile:   35 year old male Height:      70 inches Weight:      201 pounds BMI:     28.94 O2 Sat:      95 % on Room air Temp:     98.4 degrees F oral Pulse rate:   81 / minute BP sitting:   102 / 70  (left arm) Cuff size:   regular  Vitals Entered By: Zella Ball Ewing CMA Duncan Dull) (May 12, 2010 1:36 PM)  O2 Flow:  Room air CC: Left foot pain, discuss medication/RE   Primary Care Provider:  Oliver Barre, MD  CC:  Left foot pain and discuss medication/RE.  History of Present Illness: here to f/u  - c/o pain to the lfet heel and instep for 3 to 4 mo, worse in the AM with first few steps;  and later in the day after sitting;  walks for excercise at noon at work - hard soles; but still pain despite shoe inserts and actually not walking for the whole month of august;  Has stopped taking the simvastatin - was hoping he could to wthout, and cont diet only.  Due for a vaccination today.    Problems Prior to Update: 1)  Plantar Fasciitis, Left  (ICD-728.71) 2)  Crohn's Disease-large & Small Intestine  (ICD-555.2) 3)  Back Pain  (ICD-724.5) 4)  Conjunctivitis, Bilateral  (ICD-372.30) 5)  Anal Fissure  (ICD-565.0) 6)  Anal or Rectal Pain  (ICD-569.42) 7)  Hematochezia  (ICD-578.1) 8)  Dysmetabolic Syndrome  (ICD-277.7) 9)  Shoulder Dislocation  (ICD-831.00) 10)  Preventive Health Care  (ICD-V70.0) 11)  Hepatotoxicity, Drug-induced, Risk of  (ICD-V58.69) 12)  Hyperlipidemia  (ICD-272.4) 13)  Gerd  (ICD-530.81) 14)  Preventive Health Care  (ICD-V70.0) 15)  Internal Hemorrhoids  (ICD-455.0) 16)  Esophagitis, Reflux  (ICD-530.11) 17)  Hiatal Hernia  (ICD-553.3) 18)  Gastritis  (ICD-535.50) 19)  Anxiety  (ICD-300.00) 20)  Low Back Pain  (ICD-724.2) 21)  Herniated Disc  (ICD-722.2) 22)  Hx of Low Back Pain, Chronic  (ICD-724.2) 23)  Cough, Chronic  (ICD-786.2) 24)  Ibs  (ICD-564.1) 25)  Crohn's Disease  (ICD-555.9) 26)  Allergic Rhinitis   (ICD-477.9) 27)  Gerd  (ICD-530.81)  Medications Prior to Update: 1)  Zyrtec Allergy 10 Mg Tabs (Cetirizine Hcl) .... Take 1 Tablet By Mouth Once A Day As Needed 2)  Simvastatin 40 Mg Tabs (Simvastatin) .... Once Daily 3)  Entocort Ec 3 Mg  Cp24 (Budesonide) .... Take 3 Capsules Daily "as Needed" 4)  Diclofenac Sodium 75 Mg Tbec (Diclofenac Sodium) .Marland Kitchen.. 1 By Mouth Two Times A Day As Needed Pain 5)  Protonix 40 Mg Tbec (Pantoprazole Sodium) .... One Tablet By Mouth Once Daily  Current Medications (verified): 1)  Zyrtec Allergy 10 Mg Tabs (Cetirizine Hcl) .... Take 1 Tablet By Mouth Once A Day As Needed 2)  Entocort Ec 3 Mg  Cp24 (Budesonide) .... Take 3 Capsules Daily "as Needed" 3)  Diclofenac Sodium 75 Mg Tbec (Diclofenac Sodium) .Marland Kitchen.. 1 By Mouth Two Times A Day As Needed Pain 4)  Protonix 40 Mg Tbec (Pantoprazole Sodium) .... One Tablet By Mouth Once Daily 5)  Simvastatin 20 Mg Tabs (Simvastatin) .Marland Kitchen.. 1 By Mouth Once Daily 6)  Azithromycin 250 Mg Tabs (Azithromycin) .... 2po Qd For 1 Day, Then 1po Qd For 4days, Then Stop  Allergies (verified): 1)  ! Advil 2)  * Advair  Past History:  Past Medical History: Last updated: 10/19/2009 GERD with LPR Crohns ileocolitis, Dx in 2002 Allergic rhinitis IBS Low back pain chronic cough Anxiety Hyperlipidemia  Past Surgical History: Last updated: 10/08/2007 Denies surgical history  Social History: Last updated: 02/11/2009 Former Smoker quit over 6 mo, minimum now Alcohol use-no Married 1 child Drug use-no Regular exercise-yes  Risk Factors: Exercise: yes (02/11/2009)  Risk Factors: Smoking Status: quit (10/08/2007)  Review of Systems       all otherwise negative per pt -    Physical Exam  General:  alert and overweight-appearing.   Head:  normocephalic and atraumatic.   Eyes:  vision grossly intact, pupils equal, and pupils round.   Ears:  R ear normal and L ear normal.   Nose:  no external deformity and no nasal  discharge.   Mouth:  no gingival abnormalities and pharynx pink and moist.   Neck:  supple and no masses.   Lungs:  normal respiratory effort and normal breath sounds.   Heart:  normal rate and regular rhythm.   Msk:  tender left instep without redness or swelilng Extremities:  no edema, no erythema    Impression & Recommendations:  Problem # 1:  HYPERLIPIDEMIA (ICD-272.4)  The following medications were removed from the medication list:    Simvastatin 40 Mg Tabs (Simvastatin) ..... Once daily His updated medication list for this problem includes:    Simvastatin 20 Mg Tabs (Simvastatin) .Marland Kitchen... 1 by mouth once daily  Orders: TLB-Lipid Panel (80061-LIPID)  Labs Reviewed: SGOT: 18 (01/20/2010)   SGPT: 24 (01/20/2010)  Lipid Goals: Chol Goal: 200 (02/11/2009)   HDL Goal: 40 (02/11/2009)   LDL Goal: 160 (02/11/2009)   TG Goal: 150 (02/11/2009)  Prior 10 Yr Risk Heart Disease: Not enough information (02/11/2009)   HDL:37.10 (01/20/2010), 31.20 (01/07/2009)  LDL:92 (01/20/2010), DEL (78/29/5621)  Chol:162 (01/20/2010), 222 (01/07/2009)  Trig:166.0 (01/20/2010), 285.0 (01/07/2009) d/w pt - to re-start med most likely but will check labbs first, Pt to continue diet efforts, good med tolerance; to check labs - goal LDL less than 100  Problem # 2:  PREVENTIVE HEALTH CARE (ICD-V70.0) not charged - for Hep A #2 today as he is due  Problem # 3:  PLANTAR FASCIITIS, LEFT (ICD-728.71)  His updated medication list for this problem includes:    Diclofenac Sodium 75 Mg Tbec (Diclofenac sodium) .Marland Kitchen... 1 by mouth two times a day as needed pain  Orders: Podiatry Referral (Podiatry) treat as above, f/u any worsening signs or symptoms , refer podiatry for further eval and tx  Complete Medication List: 1)  Zyrtec Allergy 10 Mg Tabs (Cetirizine hcl) .... Take 1 tablet by mouth once a day as needed 2)  Entocort Ec 3 Mg Cp24 (Budesonide) .... Take 3 capsules daily "as needed" 3)  Diclofenac Sodium 75  Mg Tbec (Diclofenac sodium) .Marland Kitchen.. 1 by mouth two times a day as needed pain 4)  Protonix 40 Mg Tbec (Pantoprazole sodium) .... One tablet by mouth once daily 5)  Simvastatin 20 Mg Tabs (Simvastatin) .Marland Kitchen.. 1 by mouth once daily 6)  Azithromycin 250 Mg Tabs (Azithromycin) .... 2po qd for 1 day, then 1po qd for 4days, then stop  Other Orders: Admin 1st Vaccine (30865) Flu Vaccine 22yrs + (78469) Hepatitis A Vaccine (Adult Dose) (62952) Admin of Any Addtl Vaccine (84132)  Patient Instructions: 1)  you had the flu shot today 2)  you had the first Hep A shot today 3)  please return in 4 wks for Nurse visit  for Hep A #2 4)  You will be contacted about the referral(s) to: podiatry 5)  Continue all previous medications as before this visit  6)  please have your wife scheduled for a Visit with prior CPX labs (see [facility] as you leave); at that time she can have Hep A #2 shot, and Hep B #3 shot 7)  Please go to the Lab in the basement for your blood tests today 8)  OK to stay off the simvastatin for now, until further notified 9)  Please call the number on the [facility] for results of your testing  10)  Please schedule a follow-up appointment in 6 months with  CPX labs    Flu Vaccine Consent Questions     Do you have a history of severe allergic reactions to this vaccine? no    Any prior history of allergic reactions to egg and/or gelatin? no    Do you have a sensitivity to the preservative Thimersol? no    Do you have a past history of Guillan-Barre Syndrome? no    Do you currently have an acute febrile illness? no    Have you ever had a severe reaction to latex? no    Vaccine information given and explained to patient? yes    Are you currently pregnant? no    Lot Number:[id number]   Exp Date:03/04/2011   Site Given  Left Deltoid IMlu   Immunizations Administered:  Hepatitis A Vaccine # 1:    Vaccine Type: HepA    Site: right deltoid    Mfr: GlaxoSmithKline    Dose: 0.5 ml    Route:  IM    Given by: Zella Ball Ewing CMA (AAMA)    Exp. Date: 10/02/2011    Lot #: [id number]    VIS given: 11/22/04 version given May 12, 2010.

## 2010-10-04 NOTE — Assessment & Plan Note (Signed)
Summary: F/U FROM PROCEDURE--CH.   History of Present Illness Visit Type: Follow-up Visit Primary GI MD: Elie Goody MD [facility] Primary Provider: Oliver Barre, MD Requesting Provider: n/a Chief Complaint: Follow up from Colonoscopy, No GI complaints History of Present Illness:   This is a followup visit for Crohn's ileocolitis. His symptoms have come under excellent control since beginning Entocort in December. He relates symptoms of a sore throat and clearing his throat. He was previously diagnosed with GERD and LPR but has been off acid suppressive mediations for several years. Upper endoscopy was performed 2003, revealing gastritis, and GERD.   GI Review of Systems      Denies abdominal pain, acid reflux, belching, bloating, chest pain, dysphagia with liquids, dysphagia with solids, heartburn, loss of appetite, nausea, vomiting, vomiting blood, weight loss, and  weight gain.        Denies anal fissure, black tarry stools, change in bowel habit, constipation, diarrhea, diverticulosis, fecal incontinence, heme positive stool, hemorrhoids, irritable bowel syndrome, jaundice, light color stool, liver problems, rectal bleeding, and  rectal pain.   Current Medications (verified): 1)  Zyrtec Allergy 10 Mg Tabs (Cetirizine Hcl) .... Take 1 Tablet By Mouth Once A Day As Needed 2)  Simvastatin 40 Mg Tabs (Simvastatin) .... Once Daily 3)  Entocort Ec 3 Mg  Cp24 (Budesonide) .... Take 3 Capsules Daily 4)  Clarithromycin 500 Mg Tabs (Clarithromycin) .Marland Kitchen.. 1 By Mouth Two Times A Day 5)  Methocarbamol 750 Mg Tabs (Methocarbamol) .Marland Kitchen.. 1 By Mouth Two Times A Day As Needed 6)  Diclofenac Sodium 75 Mg Tbec (Diclofenac Sodium) .Marland Kitchen.. 1 By Mouth Two Times A Day As Needed Pain 7)  Tussionex Pennkinetic Er 8-10 Mg/21ml Lqcr (Chlorpheniramine-Hydrocodone) .Marland Kitchen.. 1 Tsp By Mouth Two Times A Day As Needed 8)  Proair Hfa 108 (90 Base) Mcg/act Aers (Albuterol Sulfate) .... 2 Puffs Four Times Per Day As  Needed  Allergies (verified): 1)  ! Advil 2)  * Advair  Past History:  Past Medical History: GERD with LPR Crohns ileocolitis, Dx in 2002 Allergic rhinitis IBS Low back pain chronic cough Anxiety Hyperlipidemia  Past Surgical History: Reviewed history from 10/08/2007 and no changes required. Denies surgical history  Family History: Reviewed history from 07/27/2009 and no changes required. father with heart disease No FH of Colon Cancer:  Social History: Reviewed history from 02/11/2009 and no changes required. Former Smoker quit over 6 mo, minimum now Alcohol use-no Married 1 child Drug use-no Regular exercise-yes  Review of Systems       The pertinent positives and negatives are noted as above and in the HPI. All other ROS were reviewed and were negative.   Vital Signs:  Patient profile:   35 year old male Height:      69 inches Weight:      194 pounds BMI:     28.75 BSA:     2.04 Pulse rate:   100 / minute Pulse rhythm:   regular BP sitting:   106 / 76  (left arm)  Vitals Entered By: Merri Ray CMA Duncan Dull) (October 19, 2009 3:17 PM)  Physical Exam  General:  Well developed, well nourished, no acute distress. Head:  Normocephalic and atraumatic. Eyes:  PERRLA, no icterus. Mouth:  No deformity or lesions, dentition normal. Lungs:  Clear throughout to auscultation. Heart:  Regular rate and rhythm; no murmurs, rubs,  or bruits. Abdomen:  Soft, nontender and nondistended. No masses, hepatosplenomegaly or hernias noted. Normal bowel sounds. Psych:  Alert and  cooperative. Normal mood and affect.  Impression & Recommendations:  Problem # 1:  CROHN'S DISEASE-LARGE & SMALL INTESTINE (ICD-555.2) Continue Entocort for 8 weeks and then taper as outlined in patient instructions. If his symptoms recur will resume therapy.  Problem # 2:  GERD (ICD-530.81) Suspected GERD with LPR leading to sore throat, and throat clearing. Protonix 40 mg daily, and  standard antireflux measures.  Patient Instructions: 1)  Pick up your prescription at your pharmacy.  2)  Starting next Monday 10/25/09 decrease Entocort to 2 tablets by mouth once daily x 1 week, then reduce again to 1 tablet by mouth once daily x 1 week then discontinue.  3)  Avoid foods high in acid content ( tomatoes, citrus juices, spicy foods) . Avoid eating within 3 to 4 hours of lying down or before exercising. Do not over eat; try smaller more frequent meals. Elevate head of bed four inches when sleeping.  4)  Copy sent to : Oliver Barre, MD 5)  Please schedule a follow-up appointment in 2 months.  6)  The medication list was reviewed and reconciled.  All changed / newly prescribed medications were explained.  A complete medication list was provided to the patient / caregiver.  Prescriptions: PROTONIX 40 MG TBEC (PANTOPRAZOLE SODIUM) one tablet by mouth once daily  #30 x 11   Entered by:   Christie Nottingham CMA (AAMA)   Authorized by:   Meryl Dare MD [facility]   Signed by:   Meryl Dare MD FACG on 10/19/2009   Method used:   Electronically to        [facility] [phone]* (retail)       [address]       [place], [place]  [address]       Ph: [phone]       Fax: [phone]   RxID:   [id number]

## 2010-10-04 NOTE — Assessment & Plan Note (Signed)
Summary: SORE THROAT FOR 4 WEEKS,COUGH-LB   Vital Signs:  Patient profile:   35 year old male Height:      69 inches Weight:      198 pounds BMI:     29.35 O2 Sat:      96 % on Room air Temp:     98.8 degrees F oral Pulse rate:   98 / minute BP sitting:   102 / 80  (left arm) Cuff size:   regular  Vitals Entered ByZella Ball Ewing (October 01, 2009 3:35 PM)  O2 Flow:  Room air CC: sore throat,cough/RE   Primary Care Provider:  Oliver Barre, MD  CC:  sore throat and cough/RE.  History of Present Illness: here with 3 days onset severe ST, fever, malaise and headache;  no cough, diarrhea, and Pt denies CP, sob, doe, wheezing, orthopnea, pnd, worsening LE edema, palps, dizziness or syncope   Problems Prior to Update: 1)  Pharyngitis-acute  (ICD-462) 2)  Back Pain  (ICD-724.5) 3)  Conjunctivitis, Bilateral  (ICD-372.30) 4)  Anal Fissure  (ICD-565.0) 5)  Anal or Rectal Pain  (ICD-569.42) 6)  Hematochezia  (ICD-578.1) 7)  Dysmetabolic Syndrome  (ICD-277.7) 8)  Shoulder Dislocation  (ICD-831.00) 9)  Preventive Health Care  (ICD-V70.0) 10)  Hepatotoxicity, Drug-induced, Risk of  (ICD-V58.69) 11)  Hyperlipidemia  (ICD-272.4) 12)  Gerd  (ICD-530.81) 13)  Preventive Health Care  (ICD-V70.0) 14)  Internal Hemorrhoids  (ICD-455.0) 15)  Esophagitis, Reflux  (ICD-530.11) 16)  Hiatal Hernia  (ICD-553.3) 17)  Gastritis  (ICD-535.50) 18)  Anxiety  (ICD-300.00) 19)  Low Back Pain  (ICD-724.2) 20)  Herniated Disc  (ICD-722.2) 21)  Hx of Low Back Pain, Chronic  (ICD-724.2) 22)  Cough, Chronic  (ICD-786.2) 23)  Ibs  (ICD-564.1) 24)  Crohn's Disease  (ICD-555.9) 25)  Allergic Rhinitis  (ICD-477.9) 26)  Gerd  (ICD-530.81)  Medications Prior to Update: 1)  Zyrtec Allergy 10 Mg Tabs (Cetirizine Hcl) .... Take 1 Tablet By Mouth Once A Day As Needed 2)  Simvastatin 40 Mg Tabs (Simvastatin) .... Once Daily 3)  Tramadol Hcl 50 Mg Tabs (Tramadol Hcl) .Marland Kitchen.. 1-2 By Mouth Qid As Needed For Pain 4)   Anusol-Hc 25 Mg Supp (Hydrocortisone Acetate) .Marland Kitchen.. 1 Supp Two Times A Day For 10 Days 5)  Prednisone 10 Mg Tabs (Prednisone) .Marland Kitchen.. 1 By Mouth Two Times A Day For 5 Days 6)  Diltiazem Cream 2 % .... Apply To Rectal Area Three Times A Day 7)  Entocort Ec 3 Mg  Cp24 (Budesonide) .... Take 3 Capsules Daily 8)  Entocort Ec 3 Mg  Cp24 (Budesonide) .... Take 3 Capsules Daily 9)  Levaquin 500 Mg Tabs (Levofloxacin) .Marland Kitchen.. 1 By Mouth Once Daily 10)  Methocarbamol 750 Mg Tabs (Methocarbamol) .Marland Kitchen.. 1 By Mouth Two Times A Day As Needed 11)  Diclofenac Sodium 75 Mg Tbec (Diclofenac Sodium) .Marland Kitchen.. 1 By Mouth Two Times A Day As Needed Pain  Current Medications (verified): 1)  Zyrtec Allergy 10 Mg Tabs (Cetirizine Hcl) .... Take 1 Tablet By Mouth Once A Day As Needed 2)  Simvastatin 40 Mg Tabs (Simvastatin) .... Once Daily 3)  Tramadol Hcl 50 Mg Tabs (Tramadol Hcl) .Marland Kitchen.. 1-2 By Mouth Qid As Needed For Pain 4)  Anusol-Hc 25 Mg Supp (Hydrocortisone Acetate) .Marland Kitchen.. 1 Supp Two Times A Day For 10 Days 5)  Prednisone 10 Mg Tabs (Prednisone) .Marland Kitchen.. 1 By Mouth Two Times A Day For 5 Days 6)  Diltiazem Cream 2 % .... Apply  To Rectal Area Three Times A Day 7)  Entocort Ec 3 Mg  Cp24 (Budesonide) .... Take 3 Capsules Daily 8)  Entocort Ec 3 Mg  Cp24 (Budesonide) .... Take 3 Capsules Daily 9)  Levaquin 500 Mg Tabs (Levofloxacin) .Marland Kitchen.. 1 By Mouth Once Daily 10)  Methocarbamol 750 Mg Tabs (Methocarbamol) .Marland Kitchen.. 1 By Mouth Two Times A Day As Needed 11)  Diclofenac Sodium 75 Mg Tbec (Diclofenac Sodium) .Marland Kitchen.. 1 By Mouth Two Times A Day As Needed Pain  Allergies (verified): 1)  ! Advil 2)  * Advair  Past History:  Past Medical History: Last updated: 07/27/2009 GERD on PPI until 6 mo ago, denies any impact on cough Crohns disease not active last 3 yr Dx in 2002 Allergic rhinitis IBS Low back pain chronic cough Anxiety Hyperlipidemia  Past Surgical History: Last updated: 10/08/2007 Denies surgical history  Social  History: Last updated: 02/11/2009 Former Smoker quit over 6 mo, minimum now Alcohol use-no Married 1 child Drug use-no Regular exercise-yes  Risk Factors: Exercise: yes (02/11/2009)  Risk Factors: Smoking Status: quit (10/08/2007)  Review of Systems       all otherwise negative per pt -  Physical Exam  General:  alert and overweight-appearing.  , mild ill  Head:  normocephalic and atraumatic.   Eyes:  vision grossly intact, pupils equal, and pupils round.   Ears:  bilat tm's erythema, sinus nontender Nose:  nasal dischargemucosal pallor and mucosal erythema.   Mouth:  pharyngeal erythema, fair dentition, and pharyngeal exudate.   Neck:  supple and cervical lymphadenopathy.   Lungs:  normal respiratory effort and normal breath sounds.   Heart:  normal rate and regular rhythm.   Extremities:  no edema, no erythema    Impression & Recommendations:  Problem # 1:  PHARYNGITIS-ACUTE (ICD-462)  His updated medication list for this problem includes:    Levaquin 500 Mg Tabs (Levofloxacin) .Marland Kitchen... 1 by mouth once daily    Diclofenac Sodium 75 Mg Tbec (Diclofenac sodium) .Marland Kitchen... 1 by mouth two times a day as needed pain treat as above, f/u any worsening signs or symptoms   Complete Medication List: 1)  Zyrtec Allergy 10 Mg Tabs (Cetirizine hcl) .... Take 1 tablet by mouth once a day as needed 2)  Simvastatin 40 Mg Tabs (Simvastatin) .... Once daily 3)  Tramadol Hcl 50 Mg Tabs (Tramadol hcl) .Marland Kitchen.. 1-2 by mouth qid as needed for pain 4)  Anusol-hc 25 Mg Supp (Hydrocortisone acetate) .Marland Kitchen.. 1 supp two times a day for 10 days 5)  Prednisone 10 Mg Tabs (Prednisone) .Marland Kitchen.. 1 by mouth two times a day for 5 days 6)  Diltiazem Cream 2 %  .... Apply to rectal area three times a day 7)  Entocort Ec 3 Mg Cp24 (Budesonide) .... Take 3 capsules daily 8)  Entocort Ec 3 Mg Cp24 (Budesonide) .... Take 3 capsules daily 9)  Levaquin 500 Mg Tabs (Levofloxacin) .Marland Kitchen.. 1 by mouth once daily 10)   Methocarbamol 750 Mg Tabs (Methocarbamol) .Marland Kitchen.. 1 by mouth two times a day as needed 11)  Diclofenac Sodium 75 Mg Tbec (Diclofenac sodium) .Marland Kitchen.. 1 by mouth two times a day as needed pain  Patient Instructions: 1)  Please take all new medications as prescribed 2)  Continue all previous medications as before this visit  3)  Please schedule a follow-up appointment as needed. Prescriptions: LEVAQUIN 500 MG TABS (LEVOFLOXACIN) 1 by mouth once daily  #10 x 0   Entered and Authorized by:   Fayrene Fearing  Ellin Mayhew MD   Signed by:   Corwin Levins MD on 10/01/2009   Method used:   Print then Give to Patient   RxID:   [id number]

## 2010-10-04 NOTE — Progress Notes (Signed)
Summary: Referral  Phone Note Call from Patient   Summary of Call: Pt came into clinic to check status fo Podiatry referral, please call pt and advise  [phone] Initial call taken by: Margaret Pyle, CMA,  June 09, 2010 2:31 PM  Follow-up for Phone Call         reent to [facility] [address] [place], [place] [address]  [phone]  awaiting appt Shelbie Proctor  June 09, 2010 2:33 PM   Additional Follow-up for Phone Call Additional follow up Details #1::        Pt informed and will expect a call form [facility] with appt details Additional Follow-up by: Margaret Pyle, CMA,  June 09, 2010 2:40 PM

## 2011-01-17 NOTE — Assessment & Plan Note (Signed)
[facility]                         GASTROENTEROLOGY OFFICE NOTE   Adam Roy, Adam Roy                        MRN:          [id number]  DATE:07/01/2007                            DOB:          28-Mar-1976    This is a return office visit for Crohn's disease.  He was initially  diagnosed in 1999.  He last underwent colonoscopy by Iva Boop,  MD,FACG in May of 2007 for hematochezia, inflammation of the terminal  ileum, and colon were noted endoscopically, however, biopsies were  unremarkable.  He was recommended to remain on Pentasa.  He has felt  well, so he decided to discontinue his Pentasa and he has not been on  any 5-ASA medications for over a year.  He had significant problems with  an ongoing cough which was felt possibly to be related to GERD with LPR  and he was treated with Protonix.  He has also discontinued Protonix and  has had no ongoing cough symptoms.  He denies any chest pain, heartburn,  regurgitation, sore throat, hoarseness, or vocal changes.  He notes no  diarrhea, rectal bleeding, or abdominal pain.   CURRENT MEDICATIONS:  Listed on the chart, updated and reviewed.   ALLERGIES:  ADVIL leading to dyspepsia.   PHYSICAL EXAMINATION:  GENERAL:  No acute distress.  VITAL SIGNS:  Weight 200.6 pounds, up 7 pounds since I last saw him in  July of 2007.  Blood pressure 104/76, pulse 86 and regular.  CHEST:  Clear to auscultation bilaterally.  HEART:  Regular rate and rhythm without murmurs.  ABDOMEN:  Soft and nontender with normal active bowel sounds.  No  palpable organomegaly, masses, or hernias.   ASSESSMENT:  History of Crohn's ileocolitis.  He is currently  asymptomatic and is clinically in remission.  I have offered him the  option of remaining on a 5-ASA agent for longterm chemoprophylaxis and  he declines.  We will plan for a return office visit in one year.  Recall colonoscopy recommended for May of 2012.     Venita Lick. Russella Dar, MD, [facility]  Electronically Signed    MTS/MedQ  DD: 07/01/2007  DT: 07/02/2007  Job #: [id number]

## 2011-01-17 NOTE — Assessment & Plan Note (Signed)
[facility]                             PULMONARY OFFICE NOTE   Adam Roy, Adam Roy                        MRN:          [id number]  DATE:04/29/2007                            DOB:          April 14, 1976    CHIEF COMPLAINT:  Chronic cough.   HISTORY OF PRESENT ILLNESS:  This is a pleasant 35 year old [place]  immigrant male who presents with complaints of cough for the last one  month. On 18th of July, he went to [place], [place] and within two days of  reaching [place] started coughing a lot. He says this was due to dust,  smog, pollution, and cigarettes in [place]. He notes this is very typical  for him when he travels to [place]. He was back in the [place] a  week later and started azithromycin and Tussionex antitussive. However,  the cough did not resolve. He was subsequently treated with antibiotics  for ten days, but the cough still persisted. He was then given a three  day prednisone burst which completely helped resolve the cough, but when  he finished the steroid burst, the cough recurred. Currently, he is just  taking Tussionex twice daily. He says this works well for, him, but  between doses the cough returns. He is also complaining that Tussionex  makes him drowsy.   This type of coughing episodes following trips to [place] are very  typical for him. It has happened at least 5 or 10 times since 2005. He  denies symptoms during off travel periods. Although he has seem doctors  for this problem he has not been subjected to CT scan or pulmonary  function tests.   He denies any precipitating factors. No fever. No sputum. No chest pain.  No edema. No orthopnea or paroxysmal nocturnal dyspnea.   PAST MEDICAL HISTORY:  1. Chronic cough, diagnosed in 1999, lasted six months back then. No      evaluation done. Cough then recurred again in 2005. Since then, he      has had episodes while visiting [place].  2. Low backache since 1999. This was in  remission until 2006. Since      2006, he has had pain every day. He underwent an MRI in 2006, and      was diagnosed to have a herniated disc. Pain gets worse when he      sleeps for 4-5 hours. He is being treated conservatively, on      maintenance diclofenac and muscle relaxant.  3. Crohn's disease. This was diagnosed in 1999. He has been in      remission for three years. He is off of all medications for last 18      months.  4. Gastroesophageal reflux disease. He says he was diagnosed with      gastroesophageal reflux disease by Dr.  Malachi Bonds in GI, but he      actually does not suffer from any reflux symptoms. However, he      continues to be on maintenance Protonix.   ALLERGIES:  HE GETS GI UPSET WITH ASPIRIN, but he is not  allergic to  latex or iodine.   CURRENT MEDICATIONS:  1. Protonix 40 mg once daily.  2. Diclofenac once daily.  3. Methocarbamol once daily.  4. Also, taking Tussionex b.i.d.   SOCIAL HISTORY:  He smokes one cigarette or two every few months. Other  than that he does not abuse alcohol or drugs. He is married and lives  with his children and wife. He works as a Producer, television/film/video. He moved to  the [place] from [place] in 1996 and then lived in [place]  until 2001. In 2001, he moved to [place].   He denies any pets at home. No birds. No dogs and no cats.   FAMILY HISTORY:  Only significant for father having heart disease.   REVIEW OF SYSTEMS:  With the cough, he has loss of appetite and weight  change and sore throat. Otherwise, review of systems as detailed in the  questionnaire and is essentially negative.   PHYSICAL FINDINGS:  VITALS: Weight 198 pounds. Temperature 98.4, blood  pressure 112/78, pulse 94, oxygen saturation is 99% on room air.  GENERAL: Anxious male, seated comfortably in the exam room.  CENTRAL NERVOUS SYSTEM: Alert and oriented x3. Normal speech and  ambulation.  CARDIOVASCULAR: S1, S2 heard normally. No murmurs and no  gallops.  Regular rate and rhythm.  HEENT: No elevated JVP. No neck nodes. Soft and palpable neck.  RESPIRATORY: Trachea is central, air entry equal on both sides. Normal  breath sounds. No wheeze and no crackles.  ABDOMEN: Soft and nontender. No mass. No organomegaly.  EXTREMITIES: No edema, no cyanosis and no clubbing.  SKIN: Intact.  JOINTS: Normal.  BACK: Normal spine. No kyphosis and no scoliosis.   LABORATORY DATA:  Performed spirometry in the clinic. FEV1 3.92 liters,  88% predicted. FVC was 4.9 liters, 91% predicted. FEV1: FVC ratio of 81.  This was a normal test with good effort.   ASSESSMENT/PLAN:  Mr. Schubert is a 65 -year-old limited smoker with  Crohn's disease in remission. He has episodes of cough when he travels  to [place] annually or semi-annually. This time the cough started with a  trip to [place] and exposure to smog and pollution there. However, the  cough has lasted for a month, which is longer than usual.   I am not sure what the etiology for the current cough is. Cough variant  asthma, eosinophilic bronchitis, reactive airways following pollution  exposure are likely etiologies. Acid reflux and post-nasal drip are  potential etiologies, but he is on Protonix and he denies any post-nasal  drip. Spirometry here in the clinic is normal.   PLAN:  Plan for a methacholine challenge test to assess for cough  variant asthma. Based on the results, I will call him and have him start  Flovent steroid inhaler and look for response. If cough is still  intractable we might have to pursue a high resolution CT scan of the  chest to look  for any bronchiectasis that might be associated with Crohn's disease and  could explain the cough.     Kalman Shan, MD  Electronically Signed    MR/MedQ  DD: 04/29/2007  DT: 04/29/2007  Job #: [id number]   cc:   Corwin Levins, MD

## 2011-01-20 NOTE — Assessment & Plan Note (Signed)
[facility]                           GASTROENTEROLOGY OFFICE NOTE   Adam Roy, Adam Roy                        MRN:          [id number]  DATE:03/19/2006                            DOB:          07-04-1976    Return office visit for Crohn's ileocolitis.  He had a recent flare with  rectal bleeding that was increasing.  He saw Dr. Leone Payor in my absence and  underwent colonoscopy on Feb 01, 2006 which showed evidence of mild ileitis.  The colonoscopy was otherwise unremarkable.  Biopsies obtained at  colonoscopy were unremarkable.  He was advised to stop diclofenac and start  Celebrex.  After reading the side effects of Celebrex he decided he would  remain off all aspirin, NSAIDs, and Cox II agents and he is using Tylenol  for control of his back pain.  He has had no rectal bleeding, diarrhea, or  abdominal pain.  He has been taking Protonix 40 mg a day for treatment of  problems with recurrent sore throats and a cough.  His symptoms are  generally active in the fall and winter.  He has no other reflux symptoms  and the therapy was started empirically by Dr. Jonny Ruiz.  Patient would like  further evaluation of this problem.  Medications listed on the chart updated  and reviewed.   MEDICATION ALLERGIES:  ADVIL leading to dyspepsia.   PHYSICAL EXAMINATION:  GENERAL:  No acute distress.  VITAL SIGNS:  Weight 193.8, blood pressure 116/62, pulse is 90 and regular.  CHEST:  Clear to auscultation bilaterally.  CARDIAC:  Regular rate and rhythm without murmurs.  ABDOMEN:  Soft and nontender with normoactive bowel sounds.  No palpable  organomegaly, masses, or hernias.   ASSESSMENT AND PLAN:  1.  Crohn's ileocolitis.  Recent flare with evidence of mild ileitis.  He      will remain off all aspirin, NSAID, and Cox II agents and use Tylenol      for pain.  Continue Pentasa at 2 g b.i.d.  Return office visit four      months.  2.  Recurrent cough and sore throat.   Gastroesophageal reflux disease is in      the differential as are multiple other potential causes.  Given that his      symptoms are generally inactive in the summer, I have asked him to      discontinue the Protonix and we will assess if his symptoms return.  If      they do return we will schedule a dual probe 24-hour ambulatory pH study      to further evaluate.  Return office visit four months.                                   Venita Lick. Pleas Koch., MD, [facility]   MTS/MedQ  DD:  03/19/2006  DT:  03/19/2006  Job #:  [id number]

## 2011-02-12 ENCOUNTER — Encounter: Payer: Self-pay | Admitting: Internal Medicine

## 2011-02-12 DIAGNOSIS — Z0000 Encounter for general adult medical examination without abnormal findings: Secondary | ICD-10-CM | POA: Insufficient documentation

## 2011-02-14 ENCOUNTER — Other Ambulatory Visit (INDEPENDENT_AMBULATORY_CARE_PROVIDER_SITE_OTHER): Payer: Self-pay

## 2011-02-14 ENCOUNTER — Other Ambulatory Visit (INDEPENDENT_AMBULATORY_CARE_PROVIDER_SITE_OTHER): Payer: Self-pay | Admitting: Internal Medicine

## 2011-02-14 ENCOUNTER — Other Ambulatory Visit: Payer: Self-pay | Admitting: Internal Medicine

## 2011-02-14 DIAGNOSIS — Z0000 Encounter for general adult medical examination without abnormal findings: Secondary | ICD-10-CM

## 2011-02-14 LAB — URINALYSIS, ROUTINE W REFLEX MICROSCOPIC
Ketones, ur: NEGATIVE
Specific Gravity, Urine: 1.015 (ref 1.000–1.030)
Total Protein, Urine: NEGATIVE
Urine Glucose: NEGATIVE
Urobilinogen, UA: 0.2 (ref 0.0–1.0)

## 2011-02-14 LAB — BASIC METABOLIC PANEL
Calcium: 9.1 mg/dL (ref 8.4–10.5)
GFR: 98.92 mL/min (ref >60.00)
Glucose, Bld: 106 mg/dL — ABNORMAL HIGH (ref 70–99)
Sodium: 136 mEq/L (ref 135–145)

## 2011-02-14 LAB — CBC WITH DIFFERENTIAL/PLATELET
Basophils Relative: 0.4 % (ref 0.0–3.0)
Eosinophils Absolute: 0.1 10*3/uL (ref 0.0–0.7)
Eosinophils Relative: 1.4 % (ref 0.0–5.0)
Hemoglobin: 15.2 g/dL (ref 13.0–17.0)
Lymphocytes Relative: 45.3 % (ref 12.0–46.0)
MCHC: 33.9 g/dL (ref 30.0–36.0)
MCV: 83 fl (ref 78.0–100.0)
Neutro Abs: 3.6 10*3/uL (ref 1.4–7.7)
Neutrophils Relative %: 46.6 % (ref 43.0–77.0)
RBC: 5.41 Mil/uL (ref 4.22–5.81)
WBC: 7.8 10*3/uL (ref 4.5–10.5)

## 2011-02-14 LAB — HEPATIC FUNCTION PANEL
ALT: 30 U/L (ref 0–53)
AST: 26 U/L (ref 0–37)
Alkaline Phosphatase: 81 U/L (ref 39–117)
Bilirubin, Direct: 0.1 mg/dL (ref 0.0–0.3)
Total Protein: 7.8 g/dL (ref 6.0–8.3)

## 2011-02-14 LAB — LIPID PANEL: HDL: 37.6 mg/dL — ABNORMAL LOW (ref >39.00)

## 2011-02-14 LAB — LDL CHOLESTEROL, DIRECT: Direct LDL: 121.7 mg/dL

## 2011-02-16 ENCOUNTER — Encounter: Payer: Self-pay | Admitting: Internal Medicine

## 2011-02-16 ENCOUNTER — Ambulatory Visit (INDEPENDENT_AMBULATORY_CARE_PROVIDER_SITE_OTHER): Payer: BC Managed Care – PPO | Admitting: Internal Medicine

## 2011-02-16 VITALS — BP 94/68 | HR 82 | Temp 98.9°F | Ht 70.0 in | Wt 213.2 lb

## 2011-02-16 DIAGNOSIS — L0591 Pilonidal cyst without abscess: Secondary | ICD-10-CM

## 2011-02-16 DIAGNOSIS — E785 Hyperlipidemia, unspecified: Secondary | ICD-10-CM

## 2011-02-16 DIAGNOSIS — Z0000 Encounter for general adult medical examination without abnormal findings: Secondary | ICD-10-CM

## 2011-02-16 MED ORDER — SIMVASTATIN 40 MG PO TABS: 40.0000 mg | ORAL_TABLET | Freq: Every evening | ORAL | Status: DC

## 2011-02-16 NOTE — Assessment & Plan Note (Signed)
Small, with slight drainage yesterday, not inflamed it appears,  to f/u any worsening symptoms or concerns, to surgury if enlarges

## 2011-02-16 NOTE — Progress Notes (Signed)
Subjective:    Patient ID: Adam Roy, male    DOB: Jun 19, 1976, 35 y.o.   MRN: [id number]  HPI  Here for wellness and f/u;  Overall doing ok;  Pt denies CP, worsening SOB, DOE, wheezing, orthopnea, PND, worsening LE edema, palpitations, dizziness or syncope.  Pt denies neurological change such as new Headache, facial or extremity weakness.  Pt denies polydipsia, polyuria, or low sugar symptoms. Pt states overall good compliance with treatment and medications, good tolerability, and trying to follow lower cholesterol diet.  Pt denies worsening depressive symptoms, suicidal ideation or panic. No fever, wt loss, night sweats, loss of appetite, or other constitutional symptoms.  Pt states good ability with ADL's, low fall risk, home safety reviewed and adequate, no significant changes in hearing or vision, and walks most days about 30 min, and does 2 hrs in the gym on the weekends,  Gained wt approx 13 lbs in past yr but plans more cardio exercise. Does mention father had CABG at 52, and his older brother just had CABG at 48yo.  Does also mention occasional small BRBPR, painless over the past yr, without abd pain, fever. Also had slight drainage yesterday from known small pilonidal cyst to upper natal cleft, but without increased pain, pus, or fever as well Past Medical History  Diagnosis Date  . Pilonidal cyst 02/16/2011  . CROHN'S DISEASE-LARGE & SMALL INTESTINE 10/19/2009  . ALLERGIC RHINITIS 04/06/2007  . ANXIETY 10/08/2007  . GERD 04/03/2007  . HYPERLIPIDEMIA 04/09/2008  . IBS 04/03/2007   No past surgical history on file.  reports that he has never smoked. He does not have any smokeless tobacco history on file. He reports that he does not drink alcohol or use illicit drugs. family history includes Heart disease in his father.  There is no history of Colon cancer. Allergies  Allergen Reactions  . Advair Hfa   . Ibuprofen    Current Outpatient Prescriptions on File Prior to Visit  Medication Sig  Dispense Refill  . DISCONTD: simvastatin (ZOCOR) 20 MG tablet Take 20 mg by mouth daily.        Marland Kitchen DISCONTD: azithromycin (ZITHROMAX) 250 MG tablet Take 2 tablets by mouth on day 1, followed by 1 tablet by mouth daily for 4 days. Use as Directed.       Marland Kitchen DISCONTD: budesonide (ENTOCORT EC) 3 MG 24 hr capsule Take 9 mg by mouth daily as needed.        Marland Kitchen DISCONTD: cetirizine (ZYRTEC) 10 MG tablet Take 10 mg by mouth daily as needed.        Marland Kitchen DISCONTD: diclofenac (VOLTAREN) 75 MG EC tablet Take 75 mg by mouth 2 (two) times daily. As needed for pain.       Marland Kitchen DISCONTD: pantoprazole (PROTONIX) 40 MG tablet Take 40 mg by mouth daily.         Review of Systems Review of Systems  Constitutional: Negative for diaphoresis, activity change, appetite change and unexpected weight change.  HENT: Negative for hearing loss, ear pain, facial swelling, mouth sores and neck stiffness.   Eyes: Negative for pain, redness and visual disturbance.  Respiratory: Negative for shortness of breath and wheezing.   Cardiovascular: Negative for chest pain and palpitations.  Gastrointestinal: Negative for diarrhea, blood in stool, abdominal distention and rectal pain.  Genitourinary: Negative for hematuria, flank pain and decreased urine volume.  Musculoskeletal: Negative for myalgias and joint swelling.  Skin: Negative for color change and wound.  Neurological: Negative for syncope and  numbness.  Hematological: Negative for adenopathy.  Psychiatric/Behavioral: Negative for hallucinations, self-injury, decreased concentration and agitation.      Objective:   Physical Exam BP 94/68  Pulse 82  Temp(Src) 98.9 F (37.2 C) (Oral)  Ht 5\' 10"  (1.778 m)  Wt 213 lb 4 oz (96.73 kg)  BMI 30.60 kg/m2  SpO2 97% Physical Exam  VS noted Constitutional: Pt is oriented to person, place, and time. Appears well-developed and well-nourished.  HENT:  Head: Normocephalic and atraumatic.  Right Ear: External ear normal.  Left Ear:  External ear normal.  Nose: Nose normal.  Mouth/Throat: Oropharynx is clear and moist.  Eyes: Conjunctivae and EOM are normal. Pupils are equal, round, and reactive to light.  Neck: Normal range of motion. Neck supple. No JVD present. No tracheal deviation present.  Cardiovascular: Normal rate, regular rhythm, normal heart sounds and intact distal pulses.   Pulmonary/Chest: Effort normal and breath sounds normal.  Abdominal: Soft. Bowel sounds are normal. There is no tenderness.  Musculoskeletal: Normal range of motion. Exhibits no edema.  Lymphadenopathy:  Has no cervical adenopathy.  Neurological: Pt is alert and oriented to person, place, and time. Pt has normal reflexes. No cranial nerve deficit.  Skin: Skin is warm and dry. No rash noted. small nontender pilonidocyst noted Psychiatric:  Has  normal mood and affect. Behavior is normal. 1+ nervous       Assessment & Plan:

## 2011-02-16 NOTE — Assessment & Plan Note (Signed)
Improved but still uncontrolled, to incr the zocor to 40 mg in light of his strong FH and goal ldl < 100

## 2011-02-16 NOTE — Patient Instructions (Signed)
Increase the zocor/simvastatin to 40 mg per day Continue all other medications as before Please return in 1 year for your yearly visit, or sooner if needed, with Lab testing done 3-5 days before

## 2011-02-16 NOTE — Assessment & Plan Note (Signed)

## 2011-02-19 IMAGING — CR DG SHOULDER 2+V*R*
3 series · 3 of 3 positions shown · non-contrast
Comparison: None

CLINICAL DATA: Right shoulder pain.  Status post dislocation 3
weeks ago.

RIGHT SHOULDER - 2+ VIEW

[view not recorded (1 of 3)]
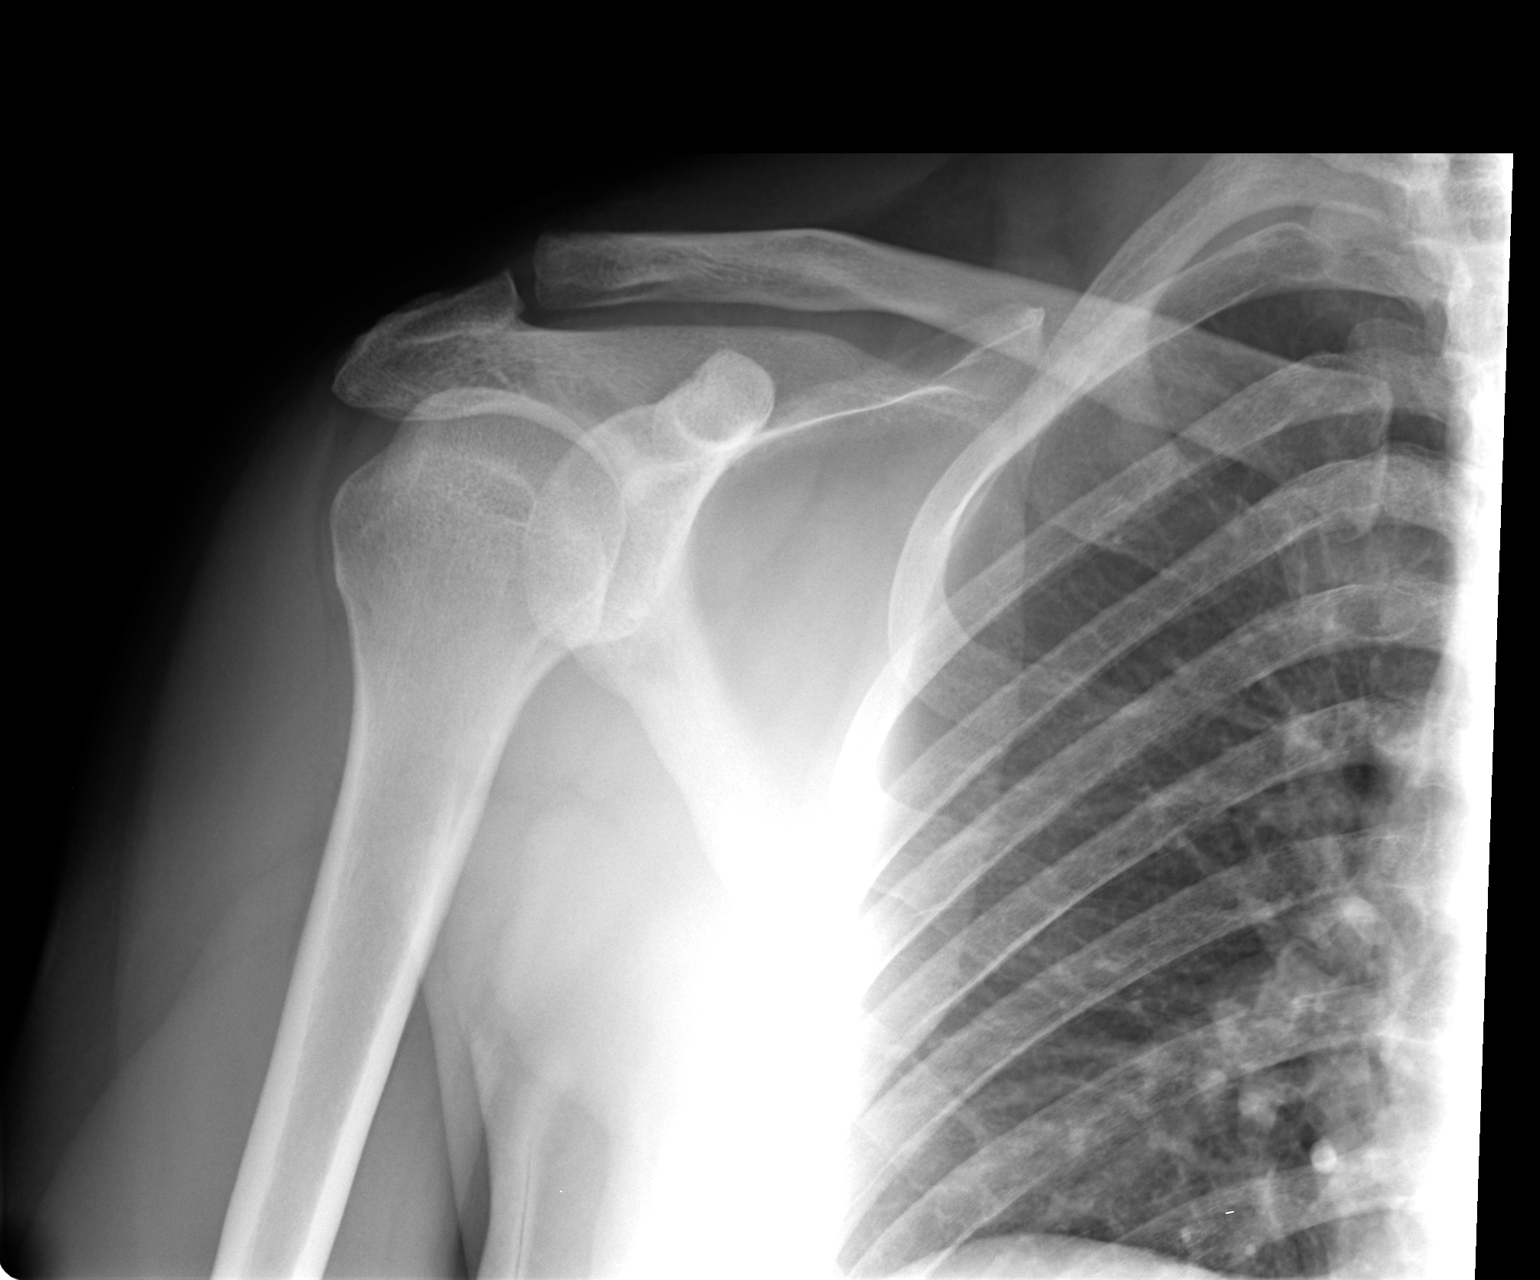

[view not recorded (2 of 3)]
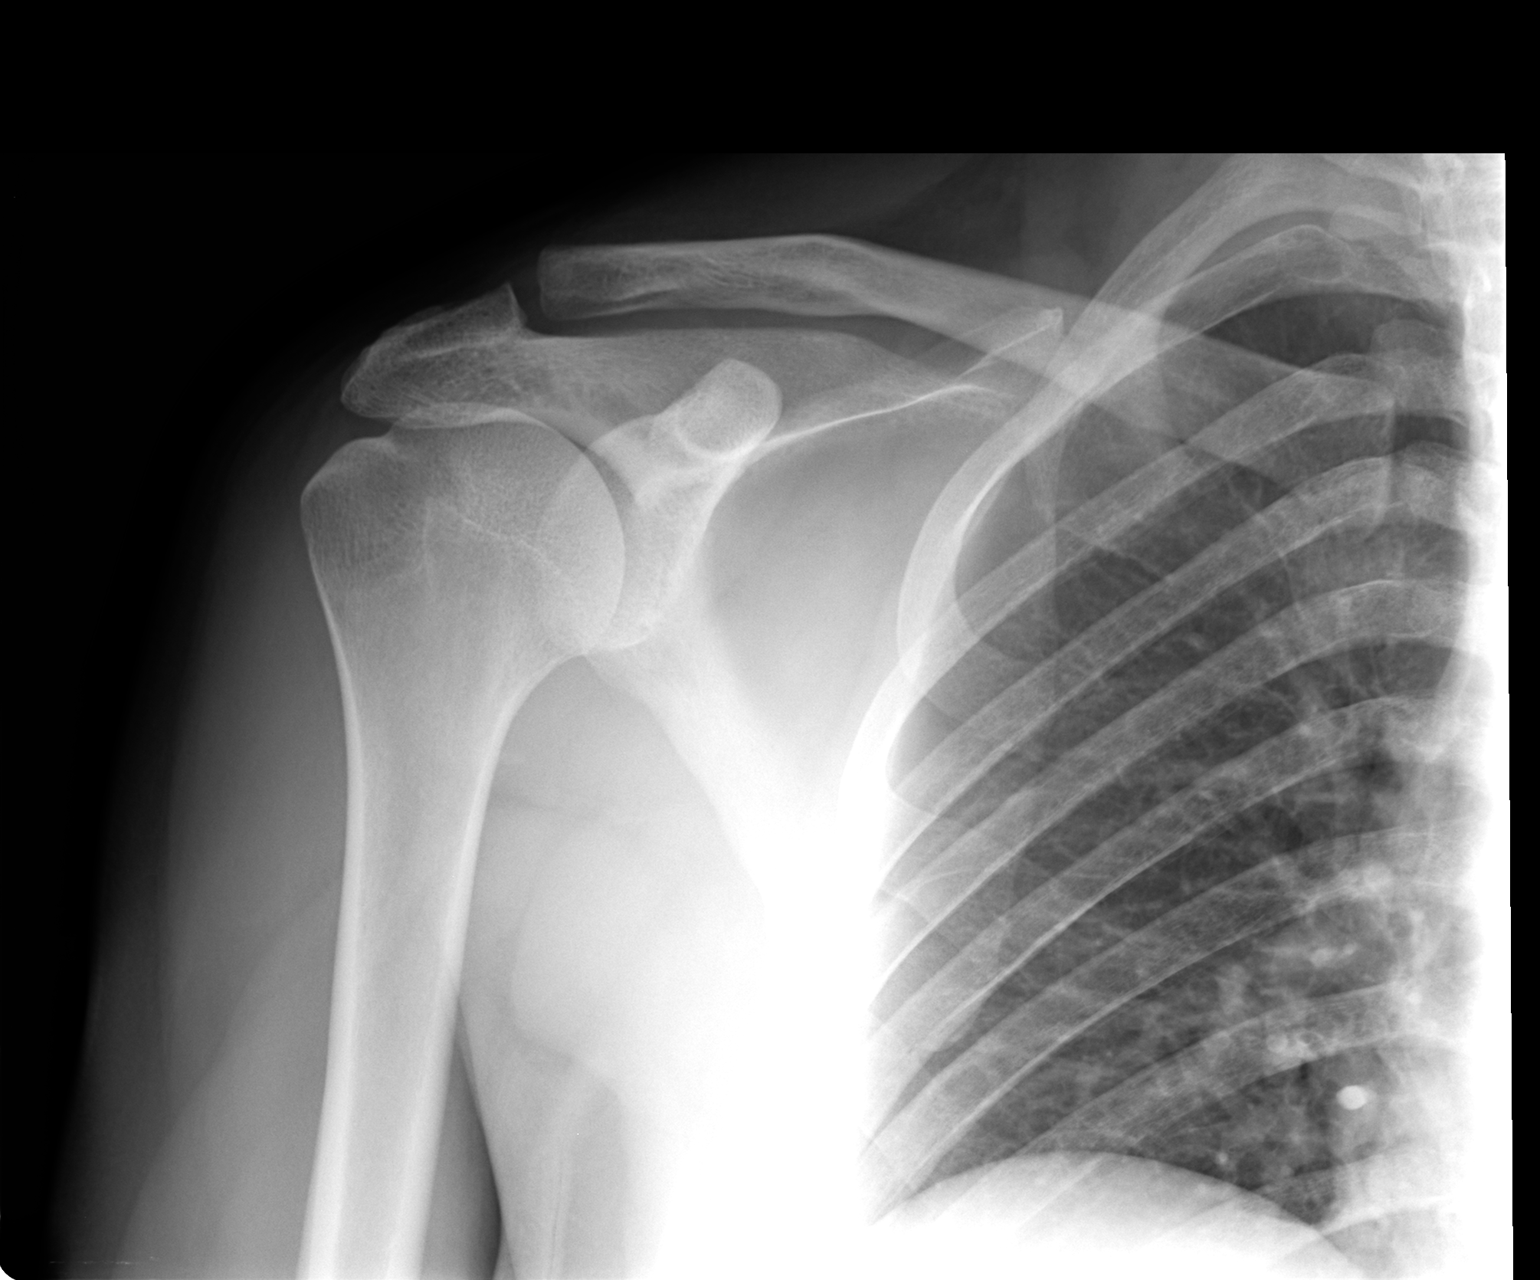

[view not recorded (3 of 3)]
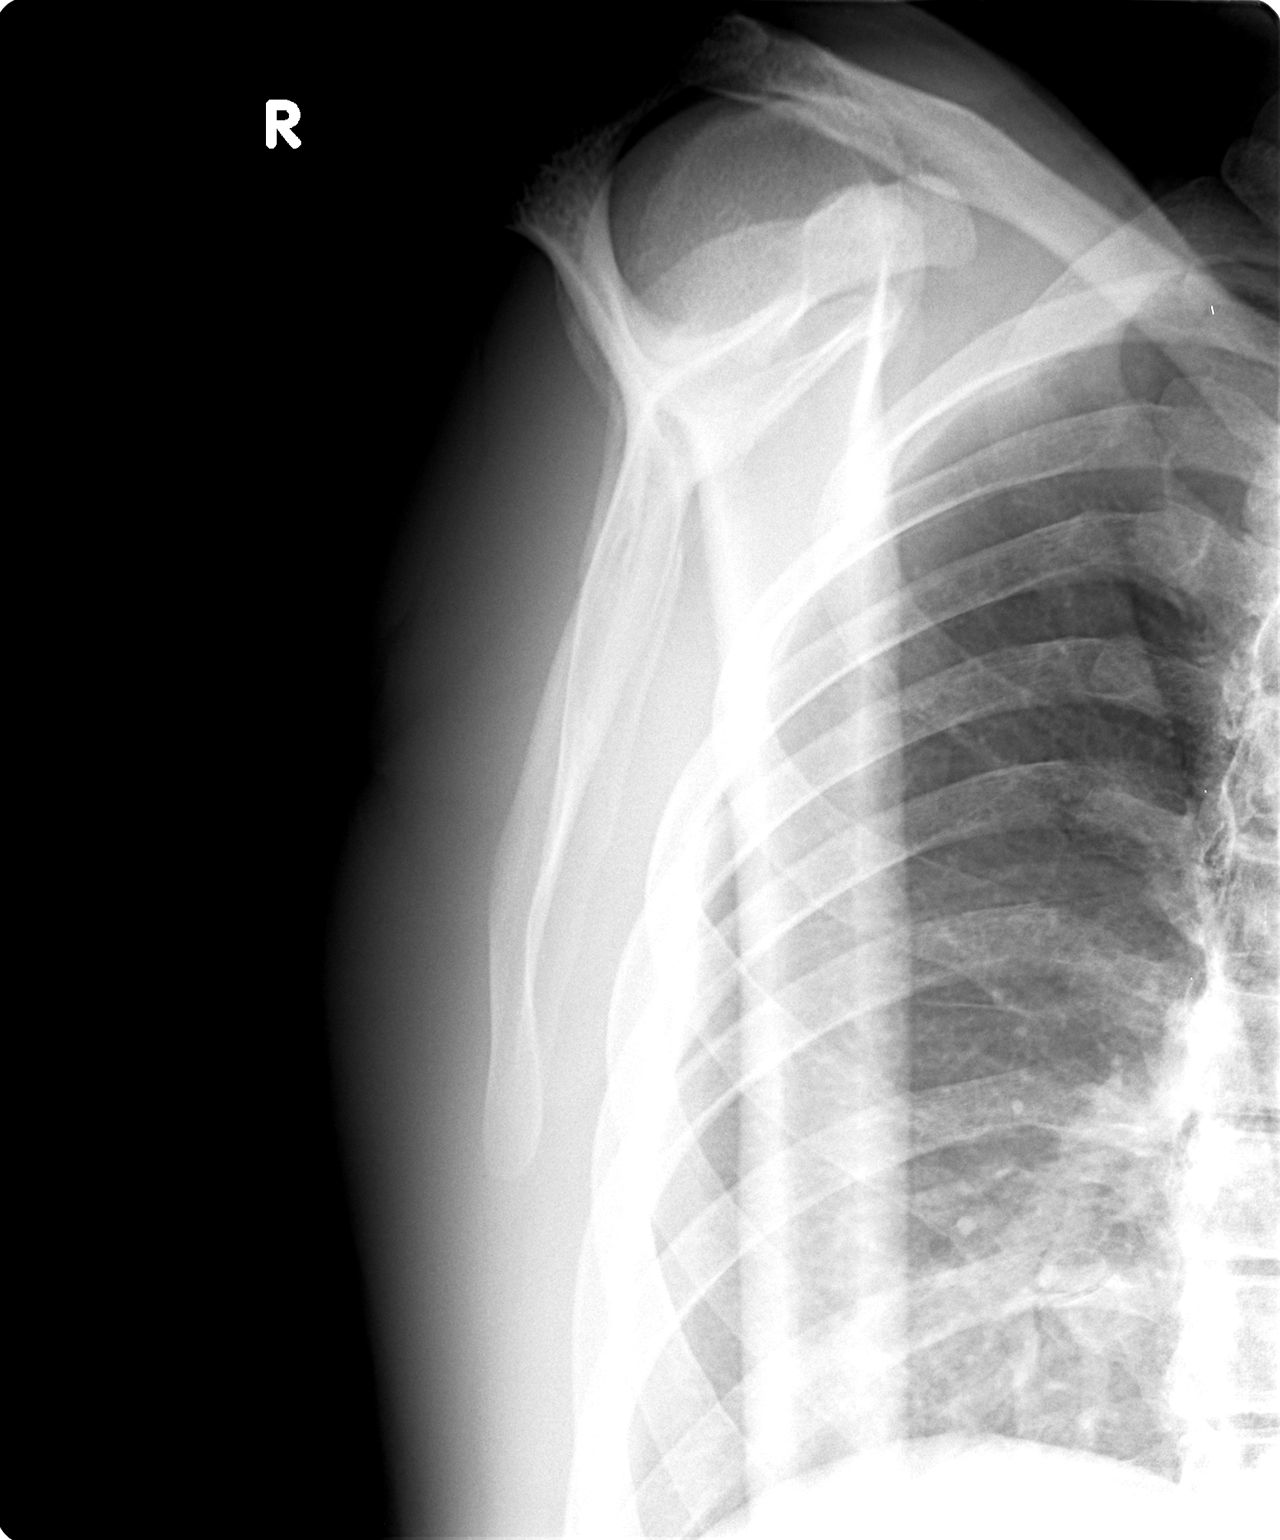

[3 of 3 positions shown; findings below may reference images not displayed]

FINDINGS: No fracture or dislocation.  The AC and glenohumeral
joints appear unremarkable.
IMPRESSION: Negative right shoulder.

## 2011-03-11 ENCOUNTER — Other Ambulatory Visit: Payer: Self-pay | Admitting: Gastroenterology

## 2011-03-14 ENCOUNTER — Other Ambulatory Visit: Payer: Self-pay | Admitting: Gastroenterology

## 2011-03-14 NOTE — Telephone Encounter (Signed)
Left a message for patient to call me back.

## 2011-03-15 NOTE — Telephone Encounter (Signed)
Left a message for patient to return my call. 

## 2011-03-16 NOTE — Telephone Encounter (Signed)
Left another message for patient to return my call. Called x 3 .

## 2011-04-20 ENCOUNTER — Ambulatory Visit: Payer: BC Managed Care – PPO | Admitting: Gastroenterology

## 2011-04-27 ENCOUNTER — Ambulatory Visit (INDEPENDENT_AMBULATORY_CARE_PROVIDER_SITE_OTHER): Payer: BC Managed Care – PPO | Admitting: Internal Medicine

## 2011-04-27 ENCOUNTER — Encounter: Payer: Self-pay | Admitting: Internal Medicine

## 2011-04-27 VITALS — BP 100/70 | HR 103 | Temp 98.2°F | Ht 70.0 in | Wt 210.2 lb

## 2011-04-27 DIAGNOSIS — J309 Allergic rhinitis, unspecified: Secondary | ICD-10-CM

## 2011-04-27 DIAGNOSIS — J019 Acute sinusitis, unspecified: Secondary | ICD-10-CM | POA: Insufficient documentation

## 2011-04-27 DIAGNOSIS — F411 Generalized anxiety disorder: Secondary | ICD-10-CM

## 2011-04-27 MED ORDER — LEVOFLOXACIN 500 MG PO TABS: 500.0000 mg | ORAL_TABLET | Freq: Every day | ORAL | Status: AC

## 2011-04-27 NOTE — Progress Notes (Signed)
  Subjective:    Patient ID: Adam Roy, male    DOB: 09-26-1975, 35 y.o.   MRN: [id number]  HPI   Here with 3 days acute onset fever, facial pain, pressure, general weakness and malaise, and greenish d/c, with slight ST, but little to no cough and Pt denies chest pain, increased sob or doe, wheezing, orthopnea, PND, increased LE swelling, palpitations, dizziness or syncope.  Does have several wks ongoing nasal allergy symptoms with clear congestion, itch and sneeze, without fever, pain, ST, cough or wheezing.  Denies worsening depressive symptoms, suicidal ideation, or panic, though has ongoing anxiety, not increased recently.  Past Medical History  Diagnosis Date  . Pilonidal cyst 02/16/2011  . CROHN'S DISEASE-LARGE & SMALL INTESTINE 2002  . ALLERGIC RHINITIS 04/06/2007  . ANXIETY 10/08/2007  . GERD 04/03/2007    w/ LPR  . HYPERLIPIDEMIA 04/09/2008  . IBS 04/03/2007  . Hiatal hernia    No past surgical history on file.  reports that he has never smoked. He does not have any smokeless tobacco history on file. He reports that he does not drink alcohol or use illicit drugs. family history includes Heart disease in his father.  There is no history of Colon cancer. Allergies  Allergen Reactions  . Advair Hfa   . Ibuprofen    Current Outpatient Prescriptions on File Prior to Visit  Medication Sig Dispense Refill  . simvastatin (ZOCOR) 40 MG tablet Take 1 tablet (40 mg total) by mouth every evening.  90 tablet  3   Review of Systems Review of Systems  Constitutional: Negative for diaphoresis and unexpected weight change.  HENT: Negative for drooling and tinnitus.   Eyes: Negative for photophobia and visual disturbance.  Respiratory: Negative for choking and stridor.   Gastrointestinal: Negative for vomiting and blood in stool.  Genitourinary: Negative for hematuria and decreased urine volume.  Musculoskeletal: Negative for gait problem. .       Objective:   Physical Exam BP 100/70   Pulse 103  Temp(Src) 98.2 F (36.8 C) (Oral)  Ht 5\' 10"  (1.778 m)  Wt 210 lb 4 oz (95.369 kg)  BMI 30.17 kg/m2  SpO2 97% Physical Exam  VS noted, mild ill Constitutional: Pt appears well-developed and well-nourished.  HENT: Head: Normocephalic.  Right Ear: External ear normal.  Left Ear: External ear normal.  Bilat tm's mild erythema.  Sinus tender bilat.  Pharynx mild erythema Eyes: Conjunctivae and EOM are normal. Pupils are equal, round, and reactive to light.  Neck: Normal range of motion. Neck supple.  Cardiovascular: Normal rate and regular rhythm.   Pulmonary/Chest: Effort normal and breath sounds normal.  Neurological: Pt is alert. No cranial nerve deficit.  Skin: Skin is warm. No erythema.  Psychiatric: Pt behavior is normal. Thought content normal. not depressed appaering, mild nervous        Assessment & Plan:

## 2011-04-27 NOTE — Patient Instructions (Signed)
Take all new medications as prescribed Continue all other medications as before Please return in 9 mo with Lab testing done 3-5 days before

## 2011-04-27 NOTE — Assessment & Plan Note (Signed)
Mild, for otc allegra prn 

## 2011-04-27 NOTE — Assessment & Plan Note (Signed)
stable overall by hx and exam, most recent data reviewed with pt, and pt to continue medical treatment as before  Lab Results  Component Value Date   WBC 7.8 02/14/2011   HGB 15.2 02/14/2011   HCT 44.9 02/14/2011   PLT 242.0 02/14/2011   CHOL 198 02/14/2011   TRIG 303.0* 02/14/2011   HDL 37.60* 02/14/2011   LDLDIRECT 121.7 02/14/2011   ALT 30 02/14/2011   AST 26 02/14/2011   NA 136 02/14/2011   K 4.1 02/14/2011   CL 103 02/14/2011   CREATININE 0.9 02/14/2011   BUN 9 02/14/2011   CO2 28 02/14/2011   TSH 2.79 02/14/2011   PSA 1.60 12/06/2007

## 2011-04-27 NOTE — Assessment & Plan Note (Signed)
Mild to mod, for antibx course,  to f/u any worsening symptoms or concerns 

## 2011-05-04 ENCOUNTER — Ambulatory Visit (INDEPENDENT_AMBULATORY_CARE_PROVIDER_SITE_OTHER): Payer: BC Managed Care – PPO | Admitting: Gastroenterology

## 2011-05-04 ENCOUNTER — Encounter: Payer: Self-pay | Admitting: Gastroenterology

## 2011-05-04 VITALS — BP 132/80 | HR 64 | Ht 70.0 in | Wt 214.0 lb

## 2011-05-04 DIAGNOSIS — K5080 Crohn's disease of both small and large intestine without complications: Secondary | ICD-10-CM

## 2011-05-04 MED ORDER — BUDESONIDE 3 MG PO CP24: 9.0000 mg | ORAL_CAPSULE | ORAL | Status: DC

## 2011-05-04 NOTE — Progress Notes (Signed)
History of Present Illness: This is a 35 year old male with Crohn's ileocolitis. He underwent colonoscopy in 2010 with activity in his ileum and ascending colon. He was treated with Entocort following that with resolution of all symptoms. He has done well on no medications. He relates  several days of small volume rectal bleeding in June without associated abdominal pain or diarrhea. He states he took Entocort 9 mg daily for 10 days and symptoms resolved and he discontinued Entocort. He is currently asymptomatic. Currently denies weight loss, abdominal pain, constipation, diarrhea, change in stool caliber, melena, hematochezia, nausea, vomiting, dysphagia, reflux symptoms, chest pain.  Current Medications, Allergies, Past Medical History, Past Surgical History, Family History and Social History were reviewed in [employer] record.  Physical Exam: General: Well developed , well nourished, no acute distress Head: Normocephalic and atraumatic Eyes:  sclerae anicteric, EOMI Ears: Normal auditory acuity Mouth: No deformity or lesions Lungs: Clear throughout to auscultation Heart: Regular rate and rhythm; no murmurs, rubs or bruits Abdomen: Soft, non tender and non distended. No masses, hepatosplenomegaly or hernias noted. Normal Bowel sounds Musculoskeletal: Symmetrical with no gross deformities  Pulses:  Normal pulses noted Extremities: No clubbing, cyanosis, edema or deformities noted Neurological: Alert oriented x 4, grossly nonfocal Psychological:  Alert and cooperative. Normal mood and affect  Assessment and Recommendations:  1. Crohn's ileocolitis. We discussed a 5-ASA agent for long-term use and he is not interested in prophylactic 5-ASA medications. He states he would only like to take medications when his symptoms flare. He requests a prescription Entocort to begin for a symptomatic flare. I advised him to contact our office if he has gastrointestinal symptoms for  evaluation and management. I reviewed his blood work from June.  2. GERD with a history of LPR. Asymptomatic on no medications.

## 2011-05-04 NOTE — Patient Instructions (Signed)
Your prescription for Entocort has been given to you to take in case you have a Crohn's flare. c: Oliver Barre, MD

## 2011-05-24 ENCOUNTER — Ambulatory Visit (INDEPENDENT_AMBULATORY_CARE_PROVIDER_SITE_OTHER): Payer: BC Managed Care – PPO | Admitting: Internal Medicine

## 2011-05-24 ENCOUNTER — Encounter: Payer: Self-pay | Admitting: Internal Medicine

## 2011-05-24 VITALS — BP 106/62 | HR 96 | Temp 98.8°F | Ht 70.0 in | Wt 215.5 lb

## 2011-05-24 DIAGNOSIS — K219 Gastro-esophageal reflux disease without esophagitis: Secondary | ICD-10-CM

## 2011-05-24 DIAGNOSIS — J019 Acute sinusitis, unspecified: Secondary | ICD-10-CM

## 2011-05-24 DIAGNOSIS — J309 Allergic rhinitis, unspecified: Secondary | ICD-10-CM

## 2011-05-24 MED ORDER — HYDROCODONE-HOMATROPINE 5-1.5 MG/5ML PO SYRP: 5.0000 mL | ORAL_SOLUTION | Freq: Four times a day (QID) | ORAL | Status: AC | PRN

## 2011-05-24 MED ORDER — FEXOFENADINE HCL 180 MG PO TABS: 180.0000 mg | ORAL_TABLET | Freq: Every day | ORAL | Status: DC

## 2011-05-24 MED ORDER — FLUTICASONE PROPIONATE 50 MCG/ACT NA SUSP: 2.0000 | Freq: Every day | NASAL | Status: DC

## 2011-05-24 MED ORDER — METHYLPREDNISOLONE ACETATE 80 MG/ML IJ SUSP
120.0000 mg | Freq: Once | INTRAMUSCULAR | Status: AC
  Administered 2011-05-24: 120 mg via INTRAMUSCULAR

## 2011-05-24 MED ORDER — PANTOPRAZOLE SODIUM 40 MG PO TBEC: 40.0000 mg | DELAYED_RELEASE_TABLET | Freq: Every day | ORAL | Status: DC

## 2011-05-24 NOTE — Assessment & Plan Note (Signed)
Mild to mod, for re-start protonix asd,  to f/u any worsening symptoms or concerns

## 2011-05-24 NOTE — Assessment & Plan Note (Signed)
Resolved, pt reassured,  to f/u any worsening symptoms or concerns 

## 2011-05-24 NOTE — Assessment & Plan Note (Signed)
Moderate to severe flare, for depomedrol IM today, allegra/flonase asd for home,  to f/u any worsening symptoms or concerns

## 2011-05-24 NOTE — Patient Instructions (Addendum)
You had the steroid shot today Take all new medications as prescribed Continue all other medications as before You can also take Mucinex (or it's generic off brand) for congestion

## 2011-05-25 ENCOUNTER — Other Ambulatory Visit: Payer: Self-pay | Admitting: Gastroenterology

## 2011-05-27 ENCOUNTER — Encounter: Payer: Self-pay | Admitting: Internal Medicine

## 2011-05-27 NOTE — Progress Notes (Signed)
  Subjective:    Patient ID: Adam Roy, male    DOB: 1976/03/05, 35 y.o.   MRN: [id number]  HPI   Here to f/u - Pt denies chest pain, increased sob or doe, wheezing, orthopnea, PND, increased LE swelling, palpitations, dizziness or syncope.  Does have several wks ongoing nasal allergy symptoms with clear congestion, itch and sneeze, without fever, pain, ST, cough or wheezing.  Has had worsening mild reflux in the past 3 wks without dysphagia, abd pain, n/v, bowel change or blood.   Pt denies polydipsia, polyuria,  Pt denies fever, wt loss, night sweats, loss of appetite, or other constitutional symptoms  Past Medical History  Diagnosis Date  . Pilonidal cyst 02/16/2011  . CROHN'S DISEASE-LARGE & SMALL INTESTINE 1999  . ALLERGIC RHINITIS 04/06/2007  . ANXIETY 10/08/2007  . GERD 04/03/2007    w/ LPR  . HYPERLIPIDEMIA 04/09/2008  . IBS 04/03/2007  . Hiatal hernia   . Internal hemorrhoids   . Plantar fasciitis   . Anal fissure    No past surgical history on file.  reports that he has never smoked. He has never used smokeless tobacco. He reports that he does not drink alcohol or use illicit drugs. family history includes Heart disease in his father.  There is no history of Colon cancer. Allergies  Allergen Reactions  . Advair Hfa   . Ibuprofen    Current Outpatient Prescriptions on File Prior to Visit  Medication Sig Dispense Refill  . budesonide (ENTOCORT EC) 3 MG 24 hr capsule Take 3 capsules (9 mg total) by mouth every morning.  90 capsule  2  . simvastatin (ZOCOR) 40 MG tablet Take 1 tablet (40 mg total) by mouth every evening.  90 tablet  3   Review of Systems Review of Systems  Constitutional: Negative for diaphoresis and unexpected weight change.  HENT: Negative for drooling and tinnitus.   Eyes: Negative for photophobia and visual disturbance.  Respiratory: Negative for choking and stridor.   Gastrointestinal: Negative for vomiting and blood in stool.  Genitourinary: Negative for  hematuria and decreased urine volume.  Musculoskeletal: Negative for gait problem.  Skin: Negative for color change and wound.  Neurological: Negative for tremors and numbness.  Psychiatric/Behavioral: Negative for decreased concentration. The patient is not hyperactive.       Objective:   Physical Exam BP 106/62  Pulse 96  Temp(Src) 98.8 F (37.1 C) (Oral)  Ht 5\' 10"  (1.778 m)  Wt 215 lb 8 oz (97.75 kg)  BMI 30.92 kg/m2  SpO2 95% Physical Exam  VS noted, no ill appearing Constitutional: Pt appears well-developed and well-nourished.  HENT: Head: Normocephalic.  Right Ear: External ear normal.  Left Ear: External ear normal.  Bilat tm's mild erythema.  Sinus nontender.  Pharynx mild erythema Eyes: Conjunctivae and EOM are normal. Pupils are equal, round, and reactive to light.  Neck: Normal range of motion. Neck supple.  Cardiovascular: Normal rate and regular rhythm.   Pulmonary/Chest: Effort normal and breath sounds normal.  Abd:  Soft, NT, non-distended, + BS Neurological: Pt is alert. No cranial nerve deficit.  Skin: Skin is warm. No erythema.  Psychiatric: Pt behavior is normal. Thought content normal.         Assessment & Plan:

## 2011-11-13 ENCOUNTER — Telehealth: Payer: Self-pay | Admitting: *Deleted

## 2011-11-13 NOTE — Telephone Encounter (Signed)
Done per emr - referred to gen surgury

## 2011-11-13 NOTE — Telephone Encounter (Signed)
Pt is requesting a referral to specialist for pilonidal cyst-pt states cyst is bothering him again when sitting and is having some drainage and blood-pt was told to call for referral if cyst started to bother him again

## 2011-11-14 NOTE — Telephone Encounter (Signed)
Patient informed. 

## 2011-11-30 ENCOUNTER — Ambulatory Visit (INDEPENDENT_AMBULATORY_CARE_PROVIDER_SITE_OTHER): Payer: BC Managed Care – PPO | Admitting: General Surgery

## 2011-12-11 ENCOUNTER — Encounter: Payer: Self-pay | Admitting: Endocrinology

## 2011-12-11 ENCOUNTER — Ambulatory Visit (INDEPENDENT_AMBULATORY_CARE_PROVIDER_SITE_OTHER): Payer: BC Managed Care – PPO | Admitting: Endocrinology

## 2011-12-11 VITALS — BP 108/72 | HR 105 | Temp 98.7°F | Ht 70.0 in | Wt 213.1 lb

## 2011-12-11 DIAGNOSIS — J069 Acute upper respiratory infection, unspecified: Secondary | ICD-10-CM

## 2011-12-11 MED ORDER — CEFUROXIME AXETIL 250 MG PO TABS: 250.0000 mg | ORAL_TABLET | Freq: Two times a day (BID) | ORAL | Status: AC

## 2011-12-11 NOTE — Progress Notes (Signed)
  Subjective:    Patient ID: Adam Roy, male    DOB: [birth year], 36 y.o.   MRN: [id number]  HPI Pt states 5 days of slight swelling of the eyes, and assoc nasal congestion.  He was seen at [facility] urgent care 2 days ago, and was rx'ed azithromycin. He says he is overall feeling slightly better, but he still has fever.   Past Medical History  Diagnosis Date  . Pilonidal cyst 02/16/2011  . CROHN'S DISEASE-LARGE & SMALL INTESTINE 1999  . ALLERGIC RHINITIS 04/06/2007  . ANXIETY 10/08/2007  . GERD 04/03/2007    w/ LPR  . HYPERLIPIDEMIA 04/09/2008  . IBS 04/03/2007  . Hiatal hernia   . Internal hemorrhoids   . Plantar fasciitis   . Anal fissure     No past surgical history on file.  History   Social History  . Marital Status: Married    Spouse Name: N/A    Number of Children: 1  . Years of Education: N/A   Occupational History  . Not on file.   Social History Main Topics  . Smoking status: Never Smoker   . Smokeless tobacco: Never Used  . Alcohol Use: No  . Drug Use: No  . Sexually Active: Not on file   Other Topics Concern  . Not on file   Social History Narrative   Regular Exercise-yes    Current Outpatient Prescriptions on File Prior to Visit  Medication Sig Dispense Refill  . pantoprazole (PROTONIX) 40 MG tablet TAKE 1 TABLET EVERY DAY  30 tablet  11  . simvastatin (ZOCOR) 40 MG tablet Take 1 tablet (40 mg total) by mouth every evening.  90 tablet  3  . budesonide (ENTOCORT EC) 3 MG 24 hr capsule Take 3 capsules (9 mg total) by mouth every morning.  90 capsule  2  . fexofenadine (ALLEGRA) 180 MG tablet Take 1 tablet (180 mg total) by mouth daily.  30 tablet  11  . fluticasone (FLONASE) 50 MCG/ACT nasal spray Place 2 sprays into the nose daily.  16 g  2    Allergies  Allergen Reactions  . Advair Hfa   . Ibuprofen     Family History  Problem Relation Age of Onset  . Heart disease Father   . Colon cancer Neg Hx     BP 108/72  Pulse 105  Temp(Src) 98.7 F (37.1  C) (Oral)  Ht 5\' 10"  (1.778 m)  Wt 213 lb 1.9 oz (96.671 kg)  BMI 30.58 kg/m2  SpO2 96%    Review of Systems he has slight epistaxis and excessive diaphoresis.  Denies earache.      Objective:   Physical Exam VITAL SIGNS:  See vs page GENERAL: no distress head: no deformity eyes: there is slight bilat periorbital swelling, but no proptosis.  There is slight bilat conjunctival injection.   external nose and ears are normal mouth: no lesion seen NECK: There is no palpable thyroid enlargement.  No thyroid nodule is palpable.  No palpable lymphadenopathy at the anterior neck. LUNGS:  Clear to auscultation       Assessment & Plan:  Glenford Peers, persistent

## 2011-12-11 NOTE — Patient Instructions (Addendum)
i have sent a prescription to your pharmacy, for a different antibiotic I hope you feel better soon.  If you don't feel better by next week, please call back.  You can take your antibiotic eye drops you have left over. Loratadine (non-prescription) will help your eye swelling and nasal drainage.

## 2012-01-25 ENCOUNTER — Encounter: Payer: BC Managed Care – PPO | Admitting: Internal Medicine

## 2012-01-31 ENCOUNTER — Other Ambulatory Visit (INDEPENDENT_AMBULATORY_CARE_PROVIDER_SITE_OTHER): Payer: BC Managed Care – PPO

## 2012-01-31 DIAGNOSIS — Z0000 Encounter for general adult medical examination without abnormal findings: Secondary | ICD-10-CM

## 2012-01-31 LAB — CBC WITH DIFFERENTIAL/PLATELET
Basophils Absolute: 0 10*3/uL (ref 0.0–0.1)
Eosinophils Relative: 1.8 % (ref 0.0–5.0)
HCT: 43.2 % (ref 39.0–52.0)
Lymphs Abs: 2.9 10*3/uL (ref 0.7–4.0)
MCV: 83.7 fl (ref 78.0–100.0)
Monocytes Absolute: 0.5 10*3/uL (ref 0.1–1.0)
Monocytes Relative: 6.9 % (ref 3.0–12.0)
Neutrophils Relative %: 51.5 % (ref 43.0–77.0)
Platelets: 249 10*3/uL (ref 150.0–400.0)
RDW: 13.9 % (ref 11.5–14.6)
WBC: 7.5 10*3/uL (ref 4.5–10.5)

## 2012-01-31 LAB — BASIC METABOLIC PANEL
CO2: 29 mEq/L (ref 19–32)
Glucose, Bld: 111 mg/dL — ABNORMAL HIGH (ref 70–99)
Potassium: 3.7 mEq/L (ref 3.5–5.1)
Sodium: 140 mEq/L (ref 135–145)

## 2012-01-31 LAB — URINALYSIS, ROUTINE W REFLEX MICROSCOPIC
Bilirubin Urine: NEGATIVE
Leukocytes, UA: NEGATIVE
Nitrite: NEGATIVE
Total Protein, Urine: NEGATIVE
pH: 6 (ref 5.0–8.0)

## 2012-01-31 LAB — HEPATIC FUNCTION PANEL
Bilirubin, Direct: 0.1 mg/dL (ref 0.0–0.3)
Total Bilirubin: 0.7 mg/dL (ref 0.3–1.2)

## 2012-01-31 LAB — HEMOGLOBIN A1C: Hgb A1c MFr Bld: 6.3 % (ref 4.6–6.5)

## 2012-01-31 LAB — TSH: TSH: 1.94 u[IU]/mL (ref 0.35–5.50)

## 2012-01-31 LAB — LIPID PANEL
HDL: 38.5 mg/dL — ABNORMAL LOW (ref >39.00)
Total CHOL/HDL Ratio: 4
VLDL: 29.6 mg/dL (ref 0.0–40.0)

## 2012-02-01 ENCOUNTER — Ambulatory Visit (INDEPENDENT_AMBULATORY_CARE_PROVIDER_SITE_OTHER): Payer: BC Managed Care – PPO | Admitting: Internal Medicine

## 2012-02-01 ENCOUNTER — Encounter: Payer: Self-pay | Admitting: Internal Medicine

## 2012-02-01 VITALS — BP 102/64 | HR 88 | Temp 98.1°F | Ht 70.0 in | Wt 211.4 lb

## 2012-02-01 DIAGNOSIS — J069 Acute upper respiratory infection, unspecified: Secondary | ICD-10-CM | POA: Insufficient documentation

## 2012-02-01 DIAGNOSIS — Z0000 Encounter for general adult medical examination without abnormal findings: Secondary | ICD-10-CM

## 2012-02-01 DIAGNOSIS — E785 Hyperlipidemia, unspecified: Secondary | ICD-10-CM

## 2012-02-01 MED ORDER — SIMVASTATIN 40 MG PO TABS: 40.0000 mg | ORAL_TABLET | Freq: Every evening | ORAL | Status: DC

## 2012-02-01 MED ORDER — AZITHROMYCIN 250 MG PO TABS: ORAL_TABLET | ORAL | Status: AC

## 2012-02-01 MED ORDER — PANTOPRAZOLE SODIUM 40 MG PO TBEC: 40.0000 mg | DELAYED_RELEASE_TABLET | Freq: Every day | ORAL | Status: DC

## 2012-02-01 NOTE — Assessment & Plan Note (Signed)
stable overall by hx and exam, most recent data reviewed with pt, and pt to continue medical treatment as before Lab Results  Component Value Date   LDLCALC 89 01/31/2012

## 2012-02-01 NOTE — Patient Instructions (Signed)
Take all new medications as prescribed - the antibiotic Continue all other medications as before You are given the refills today as requested Please continue your efforts at being more active, low cholesterol diet, and weight control. Please return in 1 year for your yearly visit, or sooner if needed, with Lab testing done 3-5 days before

## 2012-02-01 NOTE — Progress Notes (Signed)
Subjective:    Patient ID: Adam Roy, male    DOB: 02/18/1976, 36 y.o.   MRN: [id number]  HPI  Here for wellness and f/u;  Overall doing ok;  Pt denies CP, worsening SOB, DOE, wheezing, orthopnea, PND, worsening LE edema, palpitations, dizziness or syncope.  Pt denies neurological change such as new Headache, facial or extremity weakness.  Pt denies polydipsia, polyuria, or low sugar symptoms. Pt states overall good compliance with treatment and medications, good tolerability, and trying to follow lower cholesterol diet.  Pt denies worsening depressive symptoms, suicidal ideation or panic. No fever, wt loss, night sweats, loss of appetite, or other constitutional symptoms.  Pt states good ability with ADL's, low fall risk, home safety reviewed and adequate, no significant changes in hearing or vision, and occasionally active with exercise.  Did have recent pilonidal cystectomy per gen surgeon in [place], complicated with flu like symptoms, fever, chills for 3 wks, missed work, resolved after 3 wks with antibx.   Does have cough with occasional swollen glands that also has taken a while to resolve.  Allergy med and antiacid med does not help.  Past Medical History  Diagnosis Date  . Pilonidal cyst 02/16/2011  . CROHN'S DISEASE-LARGE & SMALL INTESTINE 1999  . ALLERGIC RHINITIS 04/06/2007  . ANXIETY 10/08/2007  . GERD 04/03/2007    w/ LPR  . HYPERLIPIDEMIA 04/09/2008  . IBS 04/03/2007  . Hiatal hernia   . Internal hemorrhoids   . Plantar fasciitis   . Anal fissure    Past Surgical History  Procedure Date  . Pilonidal cyst excision     march 2013    reports that he has never smoked. He has never used smokeless tobacco. He reports that he does not drink alcohol or use illicit drugs. family history includes Heart disease in his father.  There is no history of Colon cancer. Allergies  Allergen Reactions  . Fluticasone-Salmeterol   . Ibuprofen    Current Outpatient Prescriptions on File Prior to Visit    Medication Sig Dispense Refill  . pantoprazole (PROTONIX) 40 MG tablet TAKE 1 TABLET EVERY DAY  30 tablet  11  . simvastatin (ZOCOR) 40 MG tablet Take 1 tablet (40 mg total) by mouth every evening.  90 tablet  3  . azithromycin (ZITHROMAX) 250 MG tablet Take 250 mg by mouth daily.      . budesonide (ENTOCORT EC) 3 MG 24 hr capsule Take 3 capsules (9 mg total) by mouth every morning.  90 capsule  2  . fexofenadine (ALLEGRA) 180 MG tablet Take 1 tablet (180 mg total) by mouth daily.  30 tablet  11  . fluticasone (FLONASE) 50 MCG/ACT nasal spray Place 2 sprays into the nose daily.  16 g  2    Review of Systems Review of Systems  Constitutional: Negative for diaphoresis, activity change, appetite change and unexpected weight change.  HENT: Negative for hearing loss, ear pain, facial swelling, mouth sores and neck stiffness.   Eyes: Negative for pain, redness and visual disturbance.  Respiratory: Negative for shortness of breath and wheezing.   Cardiovascular: Negative for chest pain and palpitations.  Gastrointestinal: Negative for diarrhea, blood in stool, abdominal distention and rectal pain.  Genitourinary: Negative for hematuria, flank pain and decreased urine volume.  Musculoskeletal: Negative for myalgias and joint swelling.  Skin: Negative for color change and wound.  Neurological: Negative for syncope and numbness.  Hematological: Negative for adenopathy.  Psychiatric/Behavioral: Negative for hallucinations, self-injury, decreased concentration and agitation.  Objective:   Physical Exam BP 102/64  Pulse 88  Temp(Src) 98.1 F (36.7 C) (Oral)  Ht 5\' 10"  (1.778 m)  Wt 211 lb 6 oz (95.879 kg)  BMI 30.33 kg/m2  SpO2 96% Physical Exam  VS noted Constitutional: Pt is oriented to person, place, and time. Appears well-developed and well-nourished.  HENT:  Head: Normocephalic and atraumatic.  Right Ear: External ear normal.  Left Ear: External ear normal.  Nose: Nose normal.   Mouth/Throat: Oropharynx is clear and moist.  Bilat tm's mild erythema.  Sinus nontender.  Pharynx mild erythema Eyes: Conjunctivae and EOM are normal. Pupils are equal, round, and reactive to light.  Neck: Normal range of motion. Neck supple. No JVD present. No tracheal deviation present.  Cardiovascular: Normal rate, regular rhythm, normal heart sounds and intact distal pulses.   Pulmonary/Chest: Effort normal and breath sounds normal.  Abdominal: Soft. Bowel sounds are normal. There is no tenderness.  Musculoskeletal: Normal range of motion. Exhibits no edema.  Lymphadenopathy:  Has no cervical adenopathy.  Neurological: Pt is alert and oriented to person, place, and time. Pt has normal reflexes. No cranial nerve deficit.  Skin: Skin is warm and dry. No rash noted.  Psychiatric:  Has  normal mood and affect. Behavior is normal.     Assessment & Plan:

## 2012-02-03 ENCOUNTER — Encounter: Payer: Self-pay | Admitting: Internal Medicine

## 2012-02-03 NOTE — Assessment & Plan Note (Signed)
Mild to mod, for antibx course,  to f/u any worsening symptoms or concerns 

## 2012-02-03 NOTE — Assessment & Plan Note (Signed)

## 2012-11-07 ENCOUNTER — Other Ambulatory Visit: Payer: Self-pay | Admitting: Gastroenterology

## 2012-11-19 ENCOUNTER — Other Ambulatory Visit: Payer: Self-pay | Admitting: Gastroenterology

## 2013-01-24 ENCOUNTER — Other Ambulatory Visit (INDEPENDENT_AMBULATORY_CARE_PROVIDER_SITE_OTHER): Payer: BC Managed Care – PPO

## 2013-01-24 DIAGNOSIS — Z0000 Encounter for general adult medical examination without abnormal findings: Secondary | ICD-10-CM

## 2013-01-24 LAB — CBC WITH DIFFERENTIAL/PLATELET
Basophils Relative: 0.6 % (ref 0.0–3.0)
Eosinophils Relative: 1.5 % (ref 0.0–5.0)
HCT: 44.1 % (ref 39.0–52.0)
Lymphs Abs: 3.1 10*3/uL (ref 0.7–4.0)
MCV: 81.3 fl (ref 78.0–100.0)
Monocytes Absolute: 0.6 10*3/uL (ref 0.1–1.0)
Monocytes Relative: 8.5 % (ref 3.0–12.0)
Neutrophils Relative %: 47.2 % (ref 43.0–77.0)
Platelets: 254 10*3/uL (ref 150.0–400.0)
RBC: 5.42 Mil/uL (ref 4.22–5.81)
WBC: 7.3 10*3/uL (ref 4.5–10.5)

## 2013-01-24 LAB — BASIC METABOLIC PANEL
CO2: 31 mEq/L (ref 19–32)
Chloride: 102 mEq/L (ref 96–112)
Potassium: 4.3 mEq/L (ref 3.5–5.1)

## 2013-01-24 LAB — URINALYSIS, ROUTINE W REFLEX MICROSCOPIC
Bilirubin Urine: NEGATIVE
Hgb urine dipstick: NEGATIVE
Nitrite: NEGATIVE
Total Protein, Urine: NEGATIVE
Urine Glucose: NEGATIVE
pH: 7.5 (ref 5.0–8.0)

## 2013-01-24 LAB — HEPATIC FUNCTION PANEL
ALT: 30 U/L (ref 0–53)
AST: 23 U/L (ref 0–37)
Bilirubin, Direct: 0.1 mg/dL (ref 0.0–0.3)
Total Bilirubin: 0.7 mg/dL (ref 0.3–1.2)
Total Protein: 7.4 g/dL (ref 6.0–8.3)

## 2013-01-24 LAB — LIPID PANEL
LDL Cholesterol: 89 mg/dL (ref 0–99)
Total CHOL/HDL Ratio: 4

## 2013-01-24 LAB — TSH: TSH: 2.38 u[IU]/mL (ref 0.35–5.50)

## 2013-01-29 ENCOUNTER — Encounter: Payer: Self-pay | Admitting: Internal Medicine

## 2013-01-29 ENCOUNTER — Ambulatory Visit (INDEPENDENT_AMBULATORY_CARE_PROVIDER_SITE_OTHER): Payer: BC Managed Care – PPO | Admitting: Internal Medicine

## 2013-01-29 VITALS — BP 102/60 | HR 96 | Temp 98.3°F | Ht 70.0 in | Wt 203.5 lb

## 2013-01-29 DIAGNOSIS — Z0000 Encounter for general adult medical examination without abnormal findings: Secondary | ICD-10-CM

## 2013-01-29 MED ORDER — PANTOPRAZOLE SODIUM 40 MG PO TBEC: 40.0000 mg | DELAYED_RELEASE_TABLET | Freq: Every day | ORAL | Status: DC

## 2013-01-29 NOTE — Assessment & Plan Note (Signed)

## 2013-01-29 NOTE — Patient Instructions (Addendum)
Please continue all other medications as before, and refills have been done if requested. Please have the pharmacy call with any other refills you may need. Please continue your efforts at being more active, low cholesterol diet, and weight control. You are otherwise up to date with prevention measures today.  Please remember to sign up for My Chart if you have not done so, as this will be important to you in the future with finding out test results, communicating by private email, and scheduling acute appointments online when needed.  Please return in 1 year for your yearly visit, or sooner if needed, with Lab testing done 3-5 days before

## 2013-01-29 NOTE — Progress Notes (Signed)
Subjective:    Patient ID: Adam Roy, male    DOB: 1975-10-08, 37 y.o.   MRN: [id number]  HPI Here for wellness and f/u;  Overall doing ok;  Pt denies CP, worsening SOB, DOE, wheezing, orthopnea, PND, worsening LE edema, palpitations, dizziness or syncope.  Pt denies neurological change such as new headache, facial or extremity weakness.  Pt denies polydipsia, polyuria, or low sugar symptoms. Pt states overall good compliance with treatment and medications, good tolerability, and has been trying to follow lower cholesterol diet.  Pt denies worsening depressive symptoms, suicidal ideation or panic. No fever, night sweats, wt loss, loss of appetite, or other constitutional symptoms.  Pt states good ability with ADL's, has low fall risk, home safety reviewed and adequate, no other significant changes in hearing or vision, and occasionally active with exercise with walking twice per day, and gym twice per wk, and yardwork.  Past Medical History  Diagnosis Date  . Pilonidal cyst 02/16/2011  . CROHN'S DISEASE-LARGE & SMALL INTESTINE 1999  . ALLERGIC RHINITIS 04/06/2007  . ANXIETY 10/08/2007  . GERD 04/03/2007    w/ LPR  . HYPERLIPIDEMIA 04/09/2008  . IBS 04/03/2007  . Hiatal hernia   . Internal hemorrhoids   . Plantar fasciitis   . Anal fissure   . CHF (congestive heart failure)    Past Surgical History  Procedure Laterality Date  . Pilonidal cyst excision      march 2013    reports that he has never smoked. He has never used smokeless tobacco. He reports that he does not drink alcohol or use illicit drugs. family history includes Heart disease in his father.  There is no history of Colon cancer. Allergies  Allergen Reactions  . Fluticasone-Salmeterol   . Ibuprofen    Current Outpatient Prescriptions on File Prior to Visit  Medication Sig Dispense Refill  . simvastatin (ZOCOR) 40 MG tablet Take 1 tablet (40 mg total) by mouth every evening.  90 tablet  3   No current facility-administered  medications on file prior to visit.   Review of Systems Constitutional: Negative for diaphoresis, activity change, appetite change or unexpected weight change.  HENT: Negative for hearing loss, ear pain, facial swelling, mouth sores and neck stiffness.   Eyes: Negative for pain, redness and visual disturbance.  Respiratory: Negative for shortness of breath and wheezing.   Cardiovascular: Negative for chest pain and palpitations.  Gastrointestinal: Negative for diarrhea, blood in stool, abdominal distention or other pain Genitourinary: Negative for hematuria, flank pain or change in urine volume.  Musculoskeletal: Negative for myalgias and joint swelling.  Skin: Negative for color change and wound.  Neurological: Negative for syncope and numbness. other than noted Hematological: Negative for adenopathy.  Psychiatric/Behavioral: Negative for hallucinations, self-injury, decreased concentration and agitation.      Objective:   Physical Exam BP 102/60  Pulse 96  Temp(Src) 98.3 F (36.8 C) (Oral)  Ht 5\' 10"  (1.778 m)  Wt 203 lb 8 oz (92.307 kg)  BMI 29.2 kg/m2  SpO2 99% VS noted,  Constitutional: Pt is oriented to person, place, and time. Appears well-developed and well-nourished.  Head: Normocephalic and atraumatic.  Right Ear: External ear normal.  Left Ear: External ear normal.  Nose: Nose normal.  Mouth/Throat: Oropharynx is clear and moist.  Eyes: Conjunctivae and EOM are normal. Pupils are equal, round, and reactive to light.  Neck: Normal range of motion. Neck supple. No JVD present. No tracheal deviation present.  Cardiovascular: Normal rate, regular rhythm, normal  heart sounds and intact distal pulses.   Pulmonary/Chest: Effort normal and breath sounds normal.  Abdominal: Soft. Bowel sounds are normal. There is no tenderness. No HSM  Musculoskeletal: Normal range of motion. Exhibits no edema.  Lymphadenopathy:  Has no cervical adenopathy.  Neurological: Pt is alert and  oriented to person, place, and time. Pt has normal reflexes. No cranial nerve deficit.  Skin: Skin is warm and dry. No rash noted.  Psychiatric:  Has  normal mood and affect. Behavior is normal.      Assessment & Plan:

## 2013-03-10 ENCOUNTER — Other Ambulatory Visit: Payer: Self-pay | Admitting: Internal Medicine

## 2013-07-22 ENCOUNTER — Other Ambulatory Visit: Payer: Self-pay | Admitting: *Deleted

## 2013-07-22 NOTE — Telephone Encounter (Signed)
Received fax pt needing PA on his pantoprazole. Completed PA on cover-my-minds. Waiting on approval status...Raechel Chute

## 2013-07-24 NOTE — Telephone Encounter (Signed)
Received PA back med was approved. Notified pharmacy spoke with Ander Slade gave approval status...Raechel Chute

## 2014-02-03 ENCOUNTER — Other Ambulatory Visit (INDEPENDENT_AMBULATORY_CARE_PROVIDER_SITE_OTHER): Payer: BC Managed Care – PPO

## 2014-02-03 ENCOUNTER — Encounter: Payer: BC Managed Care – PPO | Admitting: Internal Medicine

## 2014-02-03 DIAGNOSIS — Z0000 Encounter for general adult medical examination without abnormal findings: Secondary | ICD-10-CM

## 2014-02-03 LAB — HEPATIC FUNCTION PANEL
ALT: 24 U/L (ref 0–53)
AST: 21 U/L (ref 0–37)
Albumin: 4.1 g/dL (ref 3.5–5.2)
Alkaline Phosphatase: 69 U/L (ref 39–117)
BILIRUBIN DIRECT: 0.1 mg/dL (ref 0.0–0.3)
BILIRUBIN TOTAL: 1 mg/dL (ref 0.2–1.2)
Total Protein: 7.3 g/dL (ref 6.0–8.3)

## 2014-02-03 LAB — BASIC METABOLIC PANEL
BUN: 13 mg/dL (ref 6–23)
CO2: 30 meq/L (ref 19–32)
Calcium: 9.3 mg/dL (ref 8.4–10.5)
Chloride: 100 mEq/L (ref 96–112)
Creatinine, Ser: 0.9 mg/dL (ref 0.4–1.5)
GFR: 100.99 mL/min (ref >60.00)
GLUCOSE: 99 mg/dL (ref 70–99)
Potassium: 4.5 mEq/L (ref 3.5–5.1)
Sodium: 138 mEq/L (ref 135–145)

## 2014-02-03 LAB — URINALYSIS, ROUTINE W REFLEX MICROSCOPIC
Bilirubin Urine: NEGATIVE
KETONES UR: NEGATIVE
Leukocytes, UA: NEGATIVE
Nitrite: NEGATIVE
PH: 7 (ref 5.0–8.0)
SPECIFIC GRAVITY, URINE: 1.01 (ref 1.000–1.030)
Total Protein, Urine: NEGATIVE
URINE GLUCOSE: NEGATIVE
UROBILINOGEN UA: 0.2 (ref 0.0–1.0)
WBC, UA: NONE SEEN (ref 0–?)

## 2014-02-03 LAB — CBC WITH DIFFERENTIAL/PLATELET
BASOS PCT: 0.4 % (ref 0.0–3.0)
Basophils Absolute: 0 10*3/uL (ref 0.0–0.1)
Eosinophils Absolute: 0.1 10*3/uL (ref 0.0–0.7)
Eosinophils Relative: 1.7 % (ref 0.0–5.0)
HCT: 45.8 % (ref 39.0–52.0)
HEMOGLOBIN: 15.2 g/dL (ref 13.0–17.0)
LYMPHS PCT: 43.4 % (ref 12.0–46.0)
Lymphs Abs: 3.5 10*3/uL (ref 0.7–4.0)
MCHC: 33.1 g/dL (ref 30.0–36.0)
MCV: 82.5 fl (ref 78.0–100.0)
MONOS PCT: 8 % (ref 3.0–12.0)
Monocytes Absolute: 0.6 10*3/uL (ref 0.1–1.0)
NEUTROS ABS: 3.7 10*3/uL (ref 1.4–7.7)
Neutrophils Relative %: 46.5 % (ref 43.0–77.0)
Platelets: 284 10*3/uL (ref 150.0–400.0)
RBC: 5.55 Mil/uL (ref 4.22–5.81)
RDW: 13.7 % (ref 11.5–15.5)
WBC: 8 10*3/uL (ref 4.0–10.5)

## 2014-02-03 LAB — TSH: TSH: 2.32 u[IU]/mL (ref 0.35–4.50)

## 2014-02-03 LAB — LIPID PANEL
CHOL/HDL RATIO: 5
Cholesterol: 177 mg/dL (ref 0–200)
HDL: 37.5 mg/dL — ABNORMAL LOW (ref >39.00)
LDL Cholesterol: 101 mg/dL — ABNORMAL HIGH (ref 0–99)
Triglycerides: 192 mg/dL — ABNORMAL HIGH (ref 0.0–149.0)
VLDL: 38.4 mg/dL (ref 0.0–40.0)

## 2014-02-03 LAB — PSA: PSA: 1.65 ng/mL (ref 0.10–4.00)

## 2014-02-05 ENCOUNTER — Encounter: Payer: Self-pay | Admitting: Internal Medicine

## 2014-02-05 ENCOUNTER — Ambulatory Visit (INDEPENDENT_AMBULATORY_CARE_PROVIDER_SITE_OTHER): Payer: BC Managed Care – PPO | Admitting: Internal Medicine

## 2014-02-05 VITALS — BP 120/82 | HR 93 | Temp 98.1°F | Ht 70.0 in | Wt 209.5 lb

## 2014-02-05 DIAGNOSIS — Z0000 Encounter for general adult medical examination without abnormal findings: Secondary | ICD-10-CM

## 2014-02-05 DIAGNOSIS — J029 Acute pharyngitis, unspecified: Secondary | ICD-10-CM

## 2014-02-05 MED ORDER — SIMVASTATIN 40 MG PO TABS: ORAL_TABLET | ORAL | Status: DC

## 2014-02-05 MED ORDER — PANTOPRAZOLE SODIUM 40 MG PO TBEC: 40.0000 mg | DELAYED_RELEASE_TABLET | Freq: Every day | ORAL | Status: DC

## 2014-02-05 MED ORDER — SIMVASTATIN 40 MG PO TABS: 40.0000 mg | ORAL_TABLET | Freq: Every day | ORAL | Status: DC

## 2014-02-05 MED ORDER — AZITHROMYCIN 250 MG PO TABS: ORAL_TABLET | ORAL | Status: DC

## 2014-02-05 NOTE — Assessment & Plan Note (Signed)
Mild to mod, for antibx course,  to f/u any worsening symptoms or concerns 

## 2014-02-05 NOTE — Assessment & Plan Note (Signed)

## 2014-02-05 NOTE — Progress Notes (Signed)
Pre visit review using our clinic review tool, if applicable. No additional management support is needed unless otherwise documented below in the visit note. 

## 2014-02-05 NOTE — Patient Instructions (Signed)
,  Please take all new medication as prescribed  Please continue all other medications as before, and refills have been done if requested.  Please have the pharmacy call with any other refills you may need.  Please continue your efforts at being more active, low cholesterol diet, and weight control.  You are otherwise up to date with prevention measures today.  Please keep your appointments with your specialists as you may have planned  Your blood work was OK today  Please return in 1 year for your yearly visit, or sooner if needed, with Lab testing done 3-5 days before

## 2014-02-05 NOTE — Progress Notes (Signed)
Subjective:    Patient ID: Adam CelesteMonther Roy, male    DOB: 10/06/1975, 38 y.o.   MRN: [id number]    HPI  Here for wellness and f/u;  Overall doing ok;  Pt denies CP, worsening SOB, DOE, wheezing, orthopnea, PND, worsening LE edema, palpitations, dizziness or syncope.  Pt denies neurological change such as new headache, facial or extremity weakness.  Pt denies polydipsia, polyuria, or low sugar symptoms. Pt states overall good compliance with treatment and medications, good tolerability, and has been trying to follow lower cholesterol diet.  Pt denies worsening depressive symptoms, suicidal ideation or panic. No fever, night sweats, wt loss, loss of appetite, or other constitutional symptoms.  Pt states good ability with ADL's, has low fall risk, home safety reviewed and adequate, no other significant changes in hearing or vision, and occasionally active with exercise, walking, lifting wts, and swim with the kids.  Incidentally with uri symptoms today, severe ST, has new 3 wk old baby at home Past Medical History  Diagnosis Date  . Pilonidal cyst 02/16/2011  . CROHN'S DISEASE-LARGE & SMALL INTESTINE 1999  . ALLERGIC RHINITIS 04/06/2007  . ANXIETY 10/08/2007  . GERD 04/03/2007    w/ LPR  . HYPERLIPIDEMIA 04/09/2008  . IBS 04/03/2007  . Hiatal hernia   . Internal hemorrhoids   . Plantar fasciitis   . Anal fissure   . CHF (congestive heart failure)    Past Surgical History  Procedure Laterality Date  . Pilonidal cyst excision      march 2013    reports that he has never smoked. He has never used smokeless tobacco. He reports that he does not drink alcohol or use illicit drugs. family history includes Heart disease in his father. There is no history of Colon cancer. Allergies  Allergen Reactions  . Fluticasone-Salmeterol   . Ibuprofen    Current Outpatient Prescriptions on File Prior to Visit  Medication Sig Dispense Refill  . pantoprazole (PROTONIX) 40 MG tablet Take 1 tablet (40 mg total) by  mouth daily.  90 tablet  3  . simvastatin (ZOCOR) 40 MG tablet TAKE 1 TABLET BY MOUTH EVERY EVENING  90 tablet  3   No current facility-administered medications on file prior to visit.   Review of Systems Constitutional: Negative for increased diaphoresis, other activity, appetite or other siginficant weight change  HENT: Negative for worsening hearing loss, ear pain, facial swelling, mouth sores and neck stiffness.   Eyes: Negative for other worsening pain, redness or visual disturbance.  Respiratory: Negative for shortness of breath and wheezing.   Cardiovascular: Negative for chest pain and palpitations.  Gastrointestinal: Negative for diarrhea, blood in stool, abdominal distention or other pain Genitourinary: Negative for hematuria, flank pain or change in urine volume.  Musculoskeletal: Negative for myalgias or other joint complaints.  Skin: Negative for color change and wound.  Neurological: Negative for syncope and numbness. other than noted Hematological: Negative for adenopathy. or other swelling Psychiatric/Behavioral: Negative for hallucinations, self-injury, decreased concentration or other worsening agitation.      Objective:   Physical Exam BP 120/82  Pulse 93  Temp(Src) 98.1 F (36.7 C) (Oral)  Ht 5\' 10"  (1.778 m)  Wt 209 lb 8 oz (95.029 kg)  BMI 30.06 kg/m2  SpO2 97% VS noted,  Constitutional: Pt is oriented to person, place, and time. Appears well-developed and well-nourished.  Head: Normocephalic and atraumatic.  Right Ear: External ear normal.  Left Ear: External ear normal.  Nose: Nose normal.  Mouth/Throat: Oropharynx  is clear and moist.  Bilat tm's with mild erythema.  Max sinus areas mild tender.  Pharynx with severe erythema, no exudate Eyes: Conjunctivae and EOM are normal. Pupils are equal, round, and reactive to light.  Neck: Normal range of motion. Neck supple. No JVD present. No tracheal deviation present.  Cardiovascular: Normal rate, regular  rhythm, normal heart sounds and intact distal pulses.   Pulmonary/Chest: Effort normal and breath sounds without rales or wheezing  Abdominal: Soft. Bowel sounds are normal. NT. No HSM  Musculoskeletal: Normal range of motion. Exhibits no edema.  Lymphadenopathy:  Has no cervical adenopathy.  Neurological: Pt is alert and oriented to person, place, and time. Pt has normal reflexes. No cranial nerve deficit. Motor grossly intact Skin: Skin is warm and dry. No rash noted.  Psychiatric:  Has normal mood and affect. Behavior is normal.      Assessment & Plan:

## 2014-06-03 ENCOUNTER — Encounter: Payer: Self-pay | Admitting: Gastroenterology

## 2015-01-13 ENCOUNTER — Encounter: Payer: Self-pay | Admitting: Gastroenterology

## 2015-02-23 ENCOUNTER — Ambulatory Visit (INDEPENDENT_AMBULATORY_CARE_PROVIDER_SITE_OTHER): Payer: BC Managed Care – PPO | Admitting: Internal Medicine

## 2015-02-23 ENCOUNTER — Other Ambulatory Visit (INDEPENDENT_AMBULATORY_CARE_PROVIDER_SITE_OTHER): Payer: BC Managed Care – PPO

## 2015-02-23 ENCOUNTER — Encounter: Payer: Self-pay | Admitting: Internal Medicine

## 2015-02-23 VITALS — BP 110/76 | HR 78 | Temp 97.7°F | Ht 70.0 in | Wt 208.0 lb

## 2015-02-23 DIAGNOSIS — K219 Gastro-esophageal reflux disease without esophagitis: Secondary | ICD-10-CM | POA: Diagnosis not present

## 2015-02-23 DIAGNOSIS — G5602 Carpal tunnel syndrome, left upper limb: Secondary | ICD-10-CM

## 2015-02-23 DIAGNOSIS — G5601 Carpal tunnel syndrome, right upper limb: Secondary | ICD-10-CM

## 2015-02-23 DIAGNOSIS — K50819 Crohn's disease of both small and large intestine with unspecified complications: Secondary | ICD-10-CM

## 2015-02-23 DIAGNOSIS — Z0000 Encounter for general adult medical examination without abnormal findings: Secondary | ICD-10-CM

## 2015-02-23 DIAGNOSIS — M25511 Pain in right shoulder: Secondary | ICD-10-CM

## 2015-02-23 DIAGNOSIS — G5603 Carpal tunnel syndrome, bilateral upper limbs: Secondary | ICD-10-CM

## 2015-02-23 LAB — URINALYSIS, ROUTINE W REFLEX MICROSCOPIC
Bilirubin Urine: NEGATIVE
Ketones, ur: NEGATIVE
Leukocytes, UA: NEGATIVE
Nitrite: NEGATIVE
SPECIFIC GRAVITY, URINE: 1.02 (ref 1.000–1.030)
TOTAL PROTEIN, URINE-UPE24: NEGATIVE
Urine Glucose: NEGATIVE
Urobilinogen, UA: 0.2 (ref 0.0–1.0)
WBC UA: NONE SEEN (ref 0–?)
pH: 6 (ref 5.0–8.0)

## 2015-02-23 LAB — CBC WITH DIFFERENTIAL/PLATELET
BASOS ABS: 0 10*3/uL (ref 0.0–0.1)
Basophils Relative: 0.3 % (ref 0.0–3.0)
Eosinophils Absolute: 0.1 10*3/uL (ref 0.0–0.7)
Eosinophils Relative: 1 % (ref 0.0–5.0)
HCT: 47.7 % (ref 39.0–52.0)
Hemoglobin: 15.7 g/dL (ref 13.0–17.0)
Lymphocytes Relative: 40 % (ref 12.0–46.0)
Lymphs Abs: 2.8 10*3/uL (ref 0.7–4.0)
MCHC: 33 g/dL (ref 30.0–36.0)
MCV: 82 fl (ref 78.0–100.0)
Monocytes Absolute: 0.3 10*3/uL (ref 0.1–1.0)
Monocytes Relative: 5 % (ref 3.0–12.0)
Neutro Abs: 3.7 10*3/uL (ref 1.4–7.7)
Neutrophils Relative %: 53.7 % (ref 43.0–77.0)
Platelets: 243 10*3/uL (ref 150.0–400.0)
RBC: 5.81 Mil/uL (ref 4.22–5.81)
RDW: 14 % (ref 11.5–15.5)
WBC: 6.9 10*3/uL (ref 4.0–10.5)

## 2015-02-23 LAB — HEPATIC FUNCTION PANEL
ALK PHOS: 70 U/L (ref 39–117)
ALT: 31 U/L (ref 0–53)
AST: 21 U/L (ref 0–37)
Albumin: 4.7 g/dL (ref 3.5–5.2)
Bilirubin, Direct: 0.1 mg/dL (ref 0.0–0.3)
Total Bilirubin: 0.9 mg/dL (ref 0.2–1.2)
Total Protein: 8.3 g/dL (ref 6.0–8.3)

## 2015-02-23 LAB — BASIC METABOLIC PANEL
BUN: 13 mg/dL (ref 6–23)
CALCIUM: 9.7 mg/dL (ref 8.4–10.5)
CHLORIDE: 101 meq/L (ref 96–112)
CO2: 31 meq/L (ref 19–32)
Creatinine, Ser: 0.95 mg/dL (ref 0.40–1.50)
GFR: 94.34 mL/min (ref >60.00)
Glucose, Bld: 106 mg/dL — ABNORMAL HIGH (ref 70–99)
Potassium: 3.8 mEq/L (ref 3.5–5.1)
SODIUM: 138 meq/L (ref 135–145)

## 2015-02-23 LAB — LIPID PANEL
Cholesterol: 185 mg/dL (ref 0–200)
HDL: 40.7 mg/dL (ref >39.00)
LDL CALC: 122 mg/dL — AB (ref 0–99)
NonHDL: 144.3
TRIGLYCERIDES: 114 mg/dL (ref 0.0–149.0)
Total CHOL/HDL Ratio: 5
VLDL: 22.8 mg/dL (ref 0.0–40.0)

## 2015-02-23 LAB — TSH: TSH: 1.74 u[IU]/mL (ref 0.35–4.50)

## 2015-02-23 MED ORDER — PANTOPRAZOLE SODIUM 40 MG PO TBEC: 40.0000 mg | DELAYED_RELEASE_TABLET | Freq: Every day | ORAL | Status: DC

## 2015-02-23 MED ORDER — SIMVASTATIN 40 MG PO TABS: 40.0000 mg | ORAL_TABLET | Freq: Every day | ORAL | Status: DC

## 2015-02-23 NOTE — Patient Instructions (Addendum)
Please wear wrist splints OTC to both wrists at night until the numbness is gone  OK to try tylenol for the shoulder pain  I can refer to Dr Smith/sports medicine in this office if the shoulder pain persists or gets worse in the next 1-2 weeks  Please continue all other medications as before, and refills have been done if requested.  Please have the pharmacy call with any other refills you may need.  Please continue your efforts at being more active, low cholesterol diet, and weight control.  You are otherwise up to date with prevention measures today.  Please keep your appointments with your specialists as you may have planned  You will be contacted regarding the referral for: colonoscopy with Dr Russella Dar; and please call your insurance regarding your copay for a "diagnostic colonoscopy"  Please go to the LAB in the Basement (turn left off the elevator) for the tests to be done today  You will be contacted by phone if any changes need to be made immediately.  Otherwise, you will receive a letter about your results with an explanation, but please check with MyChart first.  Please remember to sign up for MyChart if you have not done so, as this will be important to you in the future with finding out test results, communicating by private email, and scheduling acute appointments online when needed.  Please return in 1 year for your yearly visit, or sooner if needed, with Lab testing done 3-5 days before

## 2015-02-23 NOTE — Assessment & Plan Note (Signed)
Also due for f/u colonoscopy - will refer back

## 2015-02-23 NOTE — Assessment & Plan Note (Signed)

## 2015-02-23 NOTE — Assessment & Plan Note (Signed)
Exam benign, ok for bilat wrist splints qhs prn

## 2015-02-23 NOTE — Progress Notes (Signed)
Subjective:    Patient ID: Adam Roy, male    DOB: 12/24/1975, 39 y.o.   MRN: [id number]  HPI  Here for wellness and f/u;  Overall doing ok;  Pt denies Chest pain, worsening SOB, DOE, wheezing, orthopnea, PND, worsening LE edema, palpitations, dizziness or syncope.  Pt denies neurological change such as new headache, facial or extremity weakness.  Pt denies polydipsia, polyuria, or low sugar symptoms. Pt states overall good compliance with treatment and medications, good tolerability, and has been trying to follow appropriate diet.  Pt denies worsening depressive symptoms, suicidal ideation or panic. No fever, night sweats, wt loss, loss of appetite, or other constitutional symptoms.  Pt states good ability with ADL's, has low fall risk, home safety reviewed and adequate, no other significant changes in hearing or vision, and occasionally active with exercise with light weights mostly, has had persistent right shoulder pain x 2 days, has not been to gym for 3 wks.  Has occas neck aching with long hours at  Work but no relation temporally to shoulder. Has bilat hand numbness with waking up in the am.   Past Medical History  Diagnosis Date  . Pilonidal cyst 02/16/2011  . CROHN'S DISEASE-LARGE & SMALL INTESTINE 1999  . ALLERGIC RHINITIS 04/06/2007  . ANXIETY 10/08/2007  . GERD 04/03/2007    w/ LPR  . HYPERLIPIDEMIA 04/09/2008  . IBS 04/03/2007  . Hiatal hernia   . Internal hemorrhoids   . Plantar fasciitis   . Anal fissure   . CHF (congestive heart failure)    Past Surgical History  Procedure Laterality Date  . Pilonidal cyst excision      march 2013    reports that he has never smoked. He has never used smokeless tobacco. He reports that he does not drink alcohol or use illicit drugs. family history includes Heart disease in his father. There is no history of Colon cancer. Allergies  Allergen Reactions  . Fluticasone-Salmeterol   . Ibuprofen    Current Outpatient Prescriptions on File  Prior to Visit  Medication Sig Dispense Refill  . pantoprazole (PROTONIX) 40 MG tablet Take 1 tablet (40 mg total) by mouth daily. 90 tablet 3  . azithromycin (ZITHROMAX Z-PAK) 250 MG tablet Use as directed (Patient not taking: Reported on 02/23/2015) 6 each 1   No current facility-administered medications on file prior to visit.   Review of Systems Constitutional: Negative for increased diaphoresis, other activity, appetite or siginficant weight change other than noted HENT: Negative for worsening hearing loss, ear pain, facial swelling, mouth sores and neck stiffness.   Eyes: Negative for other worsening pain, redness or visual disturbance.  Respiratory: Negative for shortness of breath and wheezing  Cardiovascular: Negative for chest pain and palpitations.  Gastrointestinal: Negative for diarrhea, blood in stool, abdominal distention or other pain Genitourinary: Negative for hematuria, flank pain or change in urine volume.  Musculoskeletal: Negative for myalgias or other joint complaints.  Skin: Negative for color change and wound or drainage.  Neurological: Negative for syncope and numbness. other than noted Hematological: Negative for adenopathy. or other swelling Psychiatric/Behavioral: Negative for hallucinations, SI, self-injury, decreased concentration or other worsening agitation.      Objective:   Physical Exam BP 110/76 mmHg  Pulse 78  Temp(Src) 97.7 F (36.5 C) (Oral)  Ht 5\' 10"  (1.778 m)  Wt 208 lb (94.348 kg)  BMI 29.84 kg/m2  SpO2 96% VS noted,  Constitutional: Pt is oriented to person, place, and time. Appears well-developed and  well-nourished, in no significant distress Head: Normocephalic and atraumatic.  Right Ear: External ear normal.  Left Ear: External ear normal.  Nose: Nose normal.  Mouth/Throat: Oropharynx is clear and moist.  Eyes: Conjunctivae and EOM are normal. Pupils are equal, round, and reactive to light.  Neck: Normal range of motion. Neck  supple. No JVD present. No tracheal deviation present or significant neck LA or mass Cardiovascular: Normal rate, regular rhythm, normal heart sounds and intact distal pulses.   Pulmonary/Chest: Effort normal and breath sounds without rales or wheezing  Abdominal: Soft. Bowel sounds are normal. NT. No HSM  Musculoskeletal: Normal range of motion. Exhibits no edema.  Lymphadenopathy:  Has no cervical adenopathy.  Neurological: Pt is alert and oriented to person, place, and time. Pt has normal reflexes. No cranial nerve deficit. Motor grossly intact Skin: Skin is warm and dry. No rash noted.  Psychiatric:  Has normal mood and affect. Behavior is normal.  Right shoulder with diffuse mild tender but FROM, no swelling    Assessment & Plan:

## 2015-02-23 NOTE — Addendum Note (Signed)
Addended by: Anselm Jungling on: 02/23/2015 02:50 PM   Modules accepted: Orders

## 2015-02-23 NOTE — Progress Notes (Signed)
Pre visit review using our clinic review tool, if applicable. No additional management support is needed unless otherwise documented below in the visit note. 

## 2015-02-23 NOTE — Assessment & Plan Note (Signed)
?   msk strain, for tylenol prn, consider f/u with sport medicine in this office

## 2015-02-23 NOTE — Assessment & Plan Note (Signed)
Recurrent with eating late at night for Ramadan, for protonix40 qd

## 2015-02-25 ENCOUNTER — Encounter: Payer: Self-pay | Admitting: Gastroenterology

## 2015-05-20 ENCOUNTER — Ambulatory Visit: Payer: BC Managed Care – PPO | Admitting: Gastroenterology

## 2015-09-29 ENCOUNTER — Ambulatory Visit (INDEPENDENT_AMBULATORY_CARE_PROVIDER_SITE_OTHER): Payer: BC Managed Care – PPO | Admitting: Internal Medicine

## 2015-09-29 ENCOUNTER — Encounter: Payer: Self-pay | Admitting: Internal Medicine

## 2015-09-29 VITALS — BP 112/72 | HR 81 | Temp 98.7°F | Resp 20 | Wt 214.2 lb

## 2015-09-29 DIAGNOSIS — M5442 Lumbago with sciatica, left side: Secondary | ICD-10-CM

## 2015-09-29 DIAGNOSIS — M545 Low back pain, unspecified: Secondary | ICD-10-CM | POA: Insufficient documentation

## 2015-09-29 MED ORDER — CYCLOBENZAPRINE HCL 5 MG PO TABS: 5.0000 mg | ORAL_TABLET | Freq: Three times a day (TID) | ORAL | Status: DC | PRN

## 2015-09-29 MED ORDER — PREDNISONE 10 MG PO TABS: ORAL_TABLET | ORAL | Status: DC

## 2015-09-29 MED ORDER — HYDROCODONE-ACETAMINOPHEN 5-325 MG PO TABS: 1.0000 | ORAL_TABLET | Freq: Four times a day (QID) | ORAL | Status: DC | PRN

## 2015-09-29 NOTE — Patient Instructions (Signed)
Please take all new medication as prescribed - the pain medication, muscle relaxer, and prednisone  Please continue all other medications as before, and refills have been done if requested.  Please have the pharmacy call with any other refills you may need.  Please keep your appointments with your specialists as you may have planned  Please return in 2-3 wks if not improved

## 2015-09-29 NOTE — Progress Notes (Signed)
Pre visit review using our clinic review tool, if applicable. No additional management support is needed unless otherwise documented below in the visit note. 

## 2015-10-02 NOTE — Assessment & Plan Note (Signed)
4 wks persistent pain, likely lumbar disc dz flare or DJD, but no neuro change, will try pain control, muscle relaxer, predpac asd, consider PT and/or MRI if not improved 1-2 wks, sooner if worse

## 2015-10-02 NOTE — Progress Notes (Signed)
   Subjective:    Patient ID: Adam Roy, male    DOB: 1976/02/24, 40 y.o.   MRN: [id number]  HPI  Here with c/o 4 wks lower back pain mod to severe persistent constant after some lifting heavy objects.  Just not improving, but no bowel or bladder change, fever, wt loss,  worsening LE pain/numbness/weakness except for some mild pain radiating to the left knee intermittent, and no gait change or falls.  Has known hx of lumbar disc dz.  Pain worse on standing up, better to lie down.  Denies urinary symptoms such as dysuria, frequency, urgency, flank pain, hematuria or n/v, fever, chills.  Denies worsening reflux, abd pain, dysphagia, n/v, bowel change or blood.  Pt denies chest pain, increased sob or doe, wheezing, orthopnea, PND, increased LE swelling, palpitations, dizziness or syncope. Past Medical History  Diagnosis Date  . Pilonidal cyst 02/16/2011  . CROHN'S DISEASE-LARGE & SMALL INTESTINE 1999  . ALLERGIC RHINITIS 04/06/2007  . ANXIETY 10/08/2007  . GERD 04/03/2007    w/ LPR  . HYPERLIPIDEMIA 04/09/2008  . IBS 04/03/2007  . Hiatal hernia   . Internal hemorrhoids   . Plantar fasciitis   . Anal fissure   . CHF (congestive heart failure) [facility])    Past Surgical History  Procedure Laterality Date  . Pilonidal cyst excision      march 2013    reports that he has never smoked. He has never used smokeless tobacco. He reports that he does not drink alcohol or use illicit drugs. family history includes Heart disease in his father. There is no history of Colon cancer. Allergies  Allergen Reactions  . Fluticasone-Salmeterol   . Ibuprofen    Current Outpatient Prescriptions on File Prior to Visit  Medication Sig Dispense Refill  . pantoprazole (PROTONIX) 40 MG tablet Take 1 tablet (40 mg total) by mouth daily. 90 tablet 3  . simvastatin (ZOCOR) 40 MG tablet Take 1 tablet (40 mg total) by mouth at bedtime. 90 tablet 3   No current facility-administered medications on file prior to visit.    Current Outpatient Prescriptions on File Prior to Visit  Medication Sig Dispense Refill  . pantoprazole (PROTONIX) 40 MG tablet Take 1 tablet (40 mg total) by mouth daily. 90 tablet 3  . simvastatin (ZOCOR) 40 MG tablet Take 1 tablet (40 mg total) by mouth at bedtime. 90 tablet 3   No current facility-administered medications on file prior to visit.   Review of Systems  All otherwise neg per pt      Objective:   Physical Exam BP 112/72 mmHg  Pulse 81  Temp(Src) 98.7 F (37.1 C) (Oral)  Resp 20  Wt 214 lb 4 oz (97.183 kg)  SpO2 97% VS noted,  Constitutional: Pt appears in no significant distress HENT: Head: NCAT.  Right Ear: External ear normal.  Left Ear: External ear normal.  Eyes: . Pupils are equal, round, and reactive to light. Conjunctivae and EOM are normal Neck: Normal range of motion. Neck supple.  Cardiovascular: Normal rate and regular rhythm.   Pulmonary/Chest: Effort normal and breath sounds without rales or wheezing.  Abd:  Soft, NT, ND, + BS Neurological: Pt is alert. Not confused , motor 5/5 intact, sens intact to LT, dtr/gait intact Spine: nontender midline and paravertebral Skin: Skin is warm. No rash, no LE edema Psychiatric: Pt behavior is normal. No agitation.     Assessment & Plan:

## 2016-02-03 ENCOUNTER — Encounter: Payer: Self-pay | Admitting: Internal Medicine

## 2016-02-03 ENCOUNTER — Ambulatory Visit (INDEPENDENT_AMBULATORY_CARE_PROVIDER_SITE_OTHER): Payer: BC Managed Care – PPO | Admitting: Internal Medicine

## 2016-02-03 ENCOUNTER — Other Ambulatory Visit (INDEPENDENT_AMBULATORY_CARE_PROVIDER_SITE_OTHER): Payer: BC Managed Care – PPO

## 2016-02-03 VITALS — BP 128/68 | HR 81 | Temp 97.9°F | Resp 20 | Wt 214.0 lb

## 2016-02-03 DIAGNOSIS — M25562 Pain in left knee: Secondary | ICD-10-CM

## 2016-02-03 DIAGNOSIS — R6889 Other general symptoms and signs: Secondary | ICD-10-CM | POA: Diagnosis not present

## 2016-02-03 DIAGNOSIS — J309 Allergic rhinitis, unspecified: Secondary | ICD-10-CM

## 2016-02-03 DIAGNOSIS — M25561 Pain in right knee: Secondary | ICD-10-CM

## 2016-02-03 DIAGNOSIS — R0683 Snoring: Secondary | ICD-10-CM

## 2016-02-03 DIAGNOSIS — Z0001 Encounter for general adult medical examination with abnormal findings: Secondary | ICD-10-CM | POA: Diagnosis not present

## 2016-02-03 LAB — URINALYSIS, ROUTINE W REFLEX MICROSCOPIC
BILIRUBIN URINE: NEGATIVE
Ketones, ur: NEGATIVE
LEUKOCYTES UA: NEGATIVE
NITRITE: NEGATIVE
PH: 6 (ref 5.0–8.0)
Specific Gravity, Urine: 1.01 (ref 1.000–1.030)
Total Protein, Urine: NEGATIVE
UROBILINOGEN UA: 0.2 (ref 0.0–1.0)
Urine Glucose: NEGATIVE
WBC, UA: NONE SEEN (ref 0–?)

## 2016-02-03 LAB — CBC WITH DIFFERENTIAL/PLATELET
BASOS ABS: 0 10*3/uL (ref 0.0–0.1)
Basophils Relative: 0.4 % (ref 0.0–3.0)
EOS PCT: 1.4 % (ref 0.0–5.0)
Eosinophils Absolute: 0.1 10*3/uL (ref 0.0–0.7)
HCT: 44.3 % (ref 39.0–52.0)
HEMOGLOBIN: 15.2 g/dL (ref 13.0–17.0)
LYMPHS ABS: 2.8 10*3/uL (ref 0.7–4.0)
LYMPHS PCT: 40.2 % (ref 12.0–46.0)
MCHC: 34.3 g/dL (ref 30.0–36.0)
MCV: 81.8 fl (ref 78.0–100.0)
MONOS PCT: 7.2 % (ref 3.0–12.0)
Monocytes Absolute: 0.5 10*3/uL (ref 0.1–1.0)
NEUTROS PCT: 50.8 % (ref 43.0–77.0)
Neutro Abs: 3.5 10*3/uL (ref 1.4–7.7)
Platelets: 255 10*3/uL (ref 150.0–400.0)
RBC: 5.42 Mil/uL (ref 4.22–5.81)
RDW: 13 % (ref 11.5–15.5)
WBC: 6.9 10*3/uL (ref 4.0–10.5)

## 2016-02-03 LAB — BASIC METABOLIC PANEL
BUN: 11 mg/dL (ref 6–23)
CO2: 29 mEq/L (ref 19–32)
CREATININE: 0.92 mg/dL (ref 0.40–1.50)
Calcium: 9.5 mg/dL (ref 8.4–10.5)
Chloride: 102 mEq/L (ref 96–112)
GFR: 97.42 mL/min (ref >60.00)
Glucose, Bld: 119 mg/dL — ABNORMAL HIGH (ref 70–99)
Potassium: 4.2 mEq/L (ref 3.5–5.1)
Sodium: 138 mEq/L (ref 135–145)

## 2016-02-03 LAB — LIPID PANEL
CHOL/HDL RATIO: 5
Cholesterol: 184 mg/dL (ref 0–200)
HDL: 37.6 mg/dL — AB (ref >39.00)
LDL CALC: 116 mg/dL — AB (ref 0–99)
NonHDL: 146.49
TRIGLYCERIDES: 151 mg/dL — AB (ref 0.0–149.0)
VLDL: 30.2 mg/dL (ref 0.0–40.0)

## 2016-02-03 LAB — TSH: TSH: 2 u[IU]/mL (ref 0.35–4.50)

## 2016-02-03 LAB — HEPATIC FUNCTION PANEL
ALT: 42 U/L (ref 0–53)
AST: 27 U/L (ref 0–37)
Albumin: 4.5 g/dL (ref 3.5–5.2)
Alkaline Phosphatase: 62 U/L (ref 39–117)
BILIRUBIN TOTAL: 0.6 mg/dL (ref 0.2–1.2)
Bilirubin, Direct: 0.1 mg/dL (ref 0.0–0.3)
Total Protein: 7.9 g/dL (ref 6.0–8.3)

## 2016-02-03 NOTE — Patient Instructions (Signed)
Please take OTC zyrtec and Nasacort for nasal allergies and congestion, as this can hopefully help the snoring  Please call for ENT referral if the snoring is not improved in 1-2 wks  Please continue all other medications as before, and refills have been done if requested.  Please have the pharmacy call with any other refills you may need.  Please continue your efforts at being more active, low cholesterol diet, and weight control.  You are otherwise up to date with prevention measures today.  Please keep your appointments with your specialists as you may have planned  You will be contacted regarding the referral for: Dr Smith/sports medicine for the knees  Please go to the LAB in the Basement (turn left off the elevator) for the tests to be done today  You will be contacted by phone if any changes need to be made immediately.  Otherwise, you will receive a letter about your results with an explanation, but please check with MyChart first.  Please remember to sign up for MyChart if you have not done so, as this will be important to you in the future with finding out test results, communicating by private email, and scheduling acute appointments online when needed.  Please return in 1 year for your yearly visit, or sooner if needed, with Lab testing done 3-5 days before

## 2016-02-03 NOTE — Assessment & Plan Note (Signed)

## 2016-02-03 NOTE — Progress Notes (Signed)
Pre visit review using our clinic review tool, if applicable. No additional management support is needed unless otherwise documented below in the visit note. 

## 2016-02-03 NOTE — Progress Notes (Signed)
Subjective:    Patient ID: Adam Roy, male    DOB: [birth year], 40 y.o.   MRN: [id number]  HPI    Here for wellness and f/u;  Overall doing ok;  Pt denies Chest pain, worsening SOB, DOE, wheezing, orthopnea, PND, worsening LE edema, palpitations, dizziness or syncope.  Pt denies neurological change such as new headache, facial or extremity weakness.  Pt denies polydipsia, polyuria, or low sugar symptoms. Pt states overall good compliance with treatment and medications, good tolerability, and has been trying to follow appropriate diet.  Pt denies worsening depressive symptoms, suicidal ideation or panic. No fever, night sweats, wt loss, loss of appetite, or other constitutional symptoms.  Pt states good ability with ADL's, has low fall risk, home safety reviewed and adequate, no other significant changes in hearing or vision, and only occasionally active with exercise.  Also has bilat anterior knee pain, mild to mod, intermittent recurrent for over 1 yr, better with rest, nothing else makes better or rest except is activity limiting as cannot walk > 2 miles for exercise, hard to lose wt, cont;s to gain Wt Readings from Last 3 Encounters:  02/03/16 214 lb (97.07 kg)  09/29/15 214 lb 4 oz (97.183 kg)  02/23/15 208 lb (94.348 kg)  Also at home wife c/o his snoring at night, seme nights worse than others, no morning HA, no daytime somnolence,  no other specific sinus pain, no drainage or bleeding.  No hx of significant trauma.  But Does have several wks ongoing nasal allergy symptoms with clearish congestion, itch and sneezing, without fever, pain, ST, cough, swelling or wheezing.  Pt denies fever, wt loss, night sweats, loss of appetite, or other constitutional symptoms Past Medical History  Diagnosis Date  . Pilonidal cyst 02/16/2011  . CROHN'S DISEASE-LARGE & SMALL INTESTINE 1999  . ALLERGIC RHINITIS 04/06/2007  . ANXIETY 10/08/2007  . GERD 04/03/2007    w/ LPR  . HYPERLIPIDEMIA 04/09/2008  . IBS  04/03/2007  . Hiatal hernia   . Internal hemorrhoids   . Plantar fasciitis   . Anal fissure   . CHF (congestive heart failure) [facility])    Past Surgical History  Procedure Laterality Date  . Pilonidal cyst excision      march 2013    reports that he has never smoked. He has never used smokeless tobacco. He reports that he does not drink alcohol or use illicit drugs. family history includes Heart disease in his father. There is no history of Colon cancer. Allergies  Allergen Reactions  . Fluticasone-Salmeterol   . Ibuprofen    Current Outpatient Prescriptions on File Prior to Visit  Medication Sig Dispense Refill  . pantoprazole (PROTONIX) 40 MG tablet Take 1 tablet (40 mg total) by mouth daily. 90 tablet 3  . simvastatin (ZOCOR) 40 MG tablet Take 1 tablet (40 mg total) by mouth at bedtime. 90 tablet 3   No current facility-administered medications on file prior to visit.   Review of Systems  VS noted,  Constitutional: Pt is oriented to person, place, and time. Appears well-developed and well-nourished, in no significant distress Head: Normocephalic and atraumatic  Eyes: Conjunctivae and EOM are normal. Pupils are equal, round, and reactive to light Right Ear: External ear normal.  Left Ear: External ear normal Nose: Nose normal.  Mouth/Throat: Oropharynx is clear and moist  Neck: Normal range of motion. Neck supple. No JVD present. No tracheal deviation present or significant neck LA or mass Cardiovascular: Normal rate, regular rhythm, normal  heart sounds and intact distal pulses.   Pulmonary/Chest: Effort normal and breath sounds without rales or wheezing  Abdominal: Soft. Bowel sounds are normal. NT. No HSM  Musculoskeletal: Normal range of motion. Exhibits no edema Lymphadenopathy: Has no cervical adenopathy.  Neurological: Pt is alert and oriented to person, place, and time. Pt has normal reflexes. No cranial nerve deficit. Motor grossly intact Skin: Skin is warm and dry. No  rash noted or new ulcers Psychiatric:  Has normal mood and affect. Behavior is normal. \      Objective:   Physical Exam BP 128/68 mmHg  Pulse 81  Temp(Src) 97.9 F (36.6 C) (Oral)  Resp 20  Wt 214 lb (97.07 kg)  SpO2 96% VS noted,  Constitutional: Pt is oriented to person, place, and time. Appears well-developed and well-nourished, in no significant distress Head: Normocephalic and atraumatic  Eyes: Conjunctivae and EOM are normal. Pupils are equal, round, and reactive to light Right Ear: External ear normal.  Left Ear: External ear normal Nose: Nose normal.  Mouth/Throat: Oropharynx is clear and moist  Bilat tm's with mild erythema.  Max sinus areas non tender.  Pharynx with mild erythema, no exudate Neck: Normal range of motion. Neck supple. No JVD present. No tracheal deviation present or significant neck LA or mass Cardiovascular: Normal rate, regular rhythm, normal heart sounds and intact distal pulses.   Pulmonary/Chest: Effort normal and breath sounds without rales or wheezing  Abdominal: Soft. Bowel sounds are normal. NT. No HSM  Musculoskeletal: Normal range of motion. Exhibits no edema Lymphadenopathy: Has no cervical adenopathy.  Neurological: Pt is alert and oriented to person, place, and time. Pt has normal reflexes. No cranial nerve deficit. Motor grossly intact Skin: Skin is warm and dry. No rash noted or new ulcers Psychiatric:  Has normal mood and affect. Behavior is normal.  bilat knees with NT, FROM, no effusions     Assessment & Plan:

## 2016-02-06 NOTE — Assessment & Plan Note (Addendum)
Suspect allergy vs dev nasal septum, for allergy tx first per pt preference, consider ENT eval if not improved in 1-2 wks  In addition to the time spent performing CPE, I spent an additional 25 minutes face to face,in which greater than 50% of this time was spent in counseling and coordination of care for patient's acute illness as documented.

## 2016-02-06 NOTE — Assessment & Plan Note (Signed)
Mild to mod, for zyrtec/nasaocrt asd,  to f/u any worsening symptoms or concerns

## 2016-02-06 NOTE — Assessment & Plan Note (Signed)
Mild to mod, c/w possible patellofemoral syndrome, for tylenol prn, also refer to sport med/dr Katrinka Blazingsmith in this office,  to f/u any worsening symptoms or concerns

## 2016-03-19 ENCOUNTER — Other Ambulatory Visit: Payer: Self-pay | Admitting: Internal Medicine

## 2016-05-02 ENCOUNTER — Telehealth: Payer: Self-pay | Admitting: Internal Medicine

## 2016-05-02 NOTE — Telephone Encounter (Signed)
Pt has a question about a bill that he has. He was here for a CPE with Dr. Jonny RuizJohn and received a bill. He was just wondering where that came from. Please call him back

## 2016-05-03 NOTE — Telephone Encounter (Signed)
Called patient and discussed sick vs well

## 2016-08-24 ENCOUNTER — Encounter: Payer: Self-pay | Admitting: Internal Medicine

## 2016-08-24 ENCOUNTER — Other Ambulatory Visit: Payer: Self-pay | Admitting: Internal Medicine

## 2016-08-24 MED ORDER — PANTOPRAZOLE SODIUM 40 MG PO TBEC: 40.0000 mg | DELAYED_RELEASE_TABLET | Freq: Every day | ORAL | 0 refills | Status: DC

## 2016-10-01 ENCOUNTER — Other Ambulatory Visit: Payer: Self-pay | Admitting: Internal Medicine

## 2016-11-19 ENCOUNTER — Other Ambulatory Visit: Payer: Self-pay | Admitting: Internal Medicine

## 2017-02-23 ENCOUNTER — Encounter: Payer: BC Managed Care – PPO | Admitting: Internal Medicine

## 2017-02-28 ENCOUNTER — Encounter: Payer: Self-pay | Admitting: Internal Medicine

## 2017-02-28 ENCOUNTER — Other Ambulatory Visit: Payer: BC Managed Care – PPO

## 2017-02-28 ENCOUNTER — Ambulatory Visit (INDEPENDENT_AMBULATORY_CARE_PROVIDER_SITE_OTHER): Payer: BC Managed Care – PPO | Admitting: Internal Medicine

## 2017-02-28 VITALS — BP 106/70 | HR 81 | Ht 70.0 in | Wt 211.0 lb

## 2017-02-28 DIAGNOSIS — E669 Obesity, unspecified: Secondary | ICD-10-CM

## 2017-02-28 DIAGNOSIS — E785 Hyperlipidemia, unspecified: Secondary | ICD-10-CM | POA: Diagnosis not present

## 2017-02-28 DIAGNOSIS — Z114 Encounter for screening for human immunodeficiency virus [HIV]: Secondary | ICD-10-CM | POA: Diagnosis not present

## 2017-02-28 DIAGNOSIS — Z0000 Encounter for general adult medical examination without abnormal findings: Secondary | ICD-10-CM

## 2017-02-28 DIAGNOSIS — R739 Hyperglycemia, unspecified: Secondary | ICD-10-CM | POA: Diagnosis not present

## 2017-02-28 HISTORY — DX: Hyperglycemia, unspecified: R73.9

## 2017-02-28 MED ORDER — SIMVASTATIN 40 MG PO TABS: ORAL_TABLET | ORAL | 3 refills | Status: DC

## 2017-02-28 MED ORDER — PANTOPRAZOLE SODIUM 40 MG PO TBEC: 40.0000 mg | DELAYED_RELEASE_TABLET | Freq: Every day | ORAL | 3 refills | Status: DC

## 2017-02-28 NOTE — Patient Instructions (Signed)

## 2017-02-28 NOTE — Progress Notes (Signed)
Subjective:    Patient ID: Adam Roy, male    DOB: [birth year], 41 y.o.   MRN: [id number]  HPI  Here for wellness and f/u;  Overall doing ok;  Pt denies Chest pain, worsening SOB, DOE, wheezing, orthopnea, PND, worsening LE edema, palpitations, dizziness or syncope.  Pt denies neurological change such as new headache, facial or extremity weakness.  Pt denies polydipsia, polyuria, or low sugar symptoms. Pt states overall good compliance with treatment and medications, good tolerability, and has been trying to follow appropriate diet.  Pt denies worsening depressive symptoms, suicidal ideation or panic. No fever, night sweats, wt loss, loss of appetite, or other constitutional symptoms.  Pt states good ability with ADL's, has low fall risk, home safety reviewed and adequate, no other significant changes in hearing or vision, and only occasionally active with exercise.  Plans to be more active soon.  Denies urinary symptoms such as dysuria, frequency, urgency, flank pain, hematuria or n/v, fever, chills. Lost 3 lbs with recent ramadan fasting.  Wt Readings from Last 3 Encounters:  02/28/17 211 lb (95.7 kg)  02/03/16 214 lb (97.1 kg)  09/29/15 214 lb 4 oz (97.2 kg)   Past Medical History:  Diagnosis Date  . ALLERGIC RHINITIS 04/06/2007  . Anal fissure   . ANXIETY 10/08/2007  . CHF (congestive heart failure) ([facility])   . CROHN'S DISEASE-LARGE & SMALL INTESTINE 1999  . GERD 04/03/2007   w/ LPR  . Hiatal hernia   . Hyperglycemia 02/28/2017  . HYPERLIPIDEMIA 04/09/2008  . IBS 04/03/2007  . Internal hemorrhoids   . Pilonidal cyst 02/16/2011  . Plantar fasciitis    Past Surgical History:  Procedure Laterality Date  . PILONIDAL CYST EXCISION     march 2013    reports that he has never smoked. He has never used smokeless tobacco. He reports that he does not drink alcohol or use drugs. family history includes Heart disease in his father. Allergies  Allergen Reactions  . Fluticasone-Salmeterol   .  Ibuprofen    No current outpatient prescriptions on file prior to visit.   No current facility-administered medications on file prior to visit.    Review of Systems Constitutional: Negative for other unusual diaphoresis, sweats, appetite or weight changes HENT: Negative for other worsening hearing loss, ear pain, facial swelling, mouth sores or neck stiffness.   Eyes: Negative for other worsening pain, redness or other visual disturbance.  Respiratory: Negative for other stridor or swelling Cardiovascular: Negative for other palpitations or other chest pain  Gastrointestinal: Negative for worsening diarrhea or loose stools, blood in stool, distention or other pain Genitourinary: Negative for hematuria, flank pain or other change in urine volume.  Musculoskeletal: Negative for myalgias or other joint swelling.  Skin: Negative for other color change, or other wound or worsening drainage.  Neurological: Negative for other syncope or numbness. Hematological: Negative for other adenopathy or swelling Psychiatric/Behavioral: Negative for hallucinations, other worsening agitation, SI, self-injury, or new decreased concentration All other system neg per pt    Objective:   Physical Exam BP 106/70   Pulse 81   Ht 5\' 10"  (1.778 m)   Wt 211 lb (95.7 kg)   SpO2 100%   BMI 30.28 kg/m  VS noted, obese Constitutional: Pt is oriented to person, place, and time. Appears well-developed and well-nourished, in no significant distress and comfortable Head: Normocephalic and atraumatic  Eyes: Conjunctivae and EOM are normal. Pupils are equal, round, and reactive to light Right Ear: External ear normal  without discharge Left Ear: External ear normal without discharge Nose: Nose without discharge or deformity Mouth/Throat: Oropharynx is without other ulcerations and moist  Neck: Normal range of motion. Neck supple. No JVD present. No tracheal deviation present or significant neck LA or  mass Cardiovascular: Normal rate, regular rhythm, normal heart sounds and intact distal pulses.   Pulmonary/Chest: WOB normal and breath sounds without rales or wheezing  Abdominal: Soft. Bowel sounds are normal. NT. No HSM  Musculoskeletal: Normal range of motion. Exhibits no edema Lymphadenopathy: Has no other cervical adenopathy.  Neurological: Pt is alert and oriented to person, place, and time. Pt has normal reflexes. No cranial nerve deficit. Motor grossly intact, Gait intact Skin: Skin is warm and dry. No rash noted or new ulcerations Psychiatric:  Has normal mood and affect. Behavior is normal without agitation No other exam findings  Lab Results  Component Value Date   WBC 7.4 03/01/2017   HGB 15.1 03/01/2017   HCT 43.8 03/01/2017   PLT 236.0 03/01/2017   GLUCOSE 110 (H) 03/01/2017   CHOL 181 03/01/2017   TRIG 156.0 (H) 03/01/2017   HDL 41.80 03/01/2017   LDLDIRECT 121.7 02/14/2011   LDLCALC 108 (H) 03/01/2017   ALT 31 03/01/2017   AST 21 03/01/2017   NA 141 03/01/2017   K 4.3 03/01/2017   CL 103 03/01/2017   CREATININE 0.94 03/01/2017   BUN 12 03/01/2017   CO2 29 03/01/2017   TSH 2.98 03/01/2017   PSA 2.60 03/01/2017   HGBA1C 6.1 03/01/2017      Assessment & Plan:

## 2017-03-01 ENCOUNTER — Other Ambulatory Visit (INDEPENDENT_AMBULATORY_CARE_PROVIDER_SITE_OTHER): Payer: BC Managed Care – PPO

## 2017-03-01 DIAGNOSIS — R739 Hyperglycemia, unspecified: Secondary | ICD-10-CM | POA: Diagnosis not present

## 2017-03-01 DIAGNOSIS — Z0000 Encounter for general adult medical examination without abnormal findings: Secondary | ICD-10-CM | POA: Diagnosis not present

## 2017-03-01 DIAGNOSIS — Z114 Encounter for screening for human immunodeficiency virus [HIV]: Secondary | ICD-10-CM

## 2017-03-01 LAB — CBC WITH DIFFERENTIAL/PLATELET
BASOS PCT: 0.5 % (ref 0.0–3.0)
Basophils Absolute: 0 10*3/uL (ref 0.0–0.1)
EOS PCT: 1.5 % (ref 0.0–5.0)
Eosinophils Absolute: 0.1 10*3/uL (ref 0.0–0.7)
HCT: 43.8 % (ref 39.0–52.0)
Hemoglobin: 15.1 g/dL (ref 13.0–17.0)
LYMPHS ABS: 3.4 10*3/uL (ref 0.7–4.0)
Lymphocytes Relative: 45.5 % (ref 12.0–46.0)
MCHC: 34.4 g/dL (ref 30.0–36.0)
MCV: 81.9 fl (ref 78.0–100.0)
MONO ABS: 0.5 10*3/uL (ref 0.1–1.0)
MONOS PCT: 7.1 % (ref 3.0–12.0)
NEUTROS ABS: 3.3 10*3/uL (ref 1.4–7.7)
NEUTROS PCT: 45.4 % (ref 43.0–77.0)
PLATELETS: 236 10*3/uL (ref 150.0–400.0)
RBC: 5.34 Mil/uL (ref 4.22–5.81)
RDW: 13.2 % (ref 11.5–15.5)
WBC: 7.4 10*3/uL (ref 4.0–10.5)

## 2017-03-01 LAB — LIPID PANEL
CHOL/HDL RATIO: 4
Cholesterol: 181 mg/dL (ref 0–200)
HDL: 41.8 mg/dL (ref >39.00)
LDL CALC: 108 mg/dL — AB (ref 0–99)
NonHDL: 138.73
TRIGLYCERIDES: 156 mg/dL — AB (ref 0.0–149.0)
VLDL: 31.2 mg/dL (ref 0.0–40.0)

## 2017-03-01 LAB — URINALYSIS, ROUTINE W REFLEX MICROSCOPIC
Bilirubin Urine: NEGATIVE
Ketones, ur: NEGATIVE
LEUKOCYTES UA: NEGATIVE
Nitrite: NEGATIVE
RBC / HPF: NONE SEEN (ref 0–?)
SPECIFIC GRAVITY, URINE: 1.025 (ref 1.000–1.030)
TOTAL PROTEIN, URINE-UPE24: NEGATIVE
URINE GLUCOSE: NEGATIVE
Urobilinogen, UA: 0.2 (ref 0.0–1.0)
WBC, UA: NONE SEEN (ref 0–?)
pH: 6 (ref 5.0–8.0)

## 2017-03-01 LAB — BASIC METABOLIC PANEL
BUN: 12 mg/dL (ref 6–23)
CALCIUM: 9.4 mg/dL (ref 8.4–10.5)
CO2: 29 mEq/L (ref 19–32)
Chloride: 103 mEq/L (ref 96–112)
Creatinine, Ser: 0.94 mg/dL (ref 0.40–1.50)
GFR: 94.5 mL/min (ref >60.00)
GLUCOSE: 110 mg/dL — AB (ref 70–99)
POTASSIUM: 4.3 meq/L (ref 3.5–5.1)
SODIUM: 141 meq/L (ref 135–145)

## 2017-03-01 LAB — PSA: PSA: 2.6 ng/mL (ref 0.10–4.00)

## 2017-03-01 LAB — HEPATIC FUNCTION PANEL
ALBUMIN: 4.4 g/dL (ref 3.5–5.2)
ALK PHOS: 67 U/L (ref 39–117)
ALT: 31 U/L (ref 0–53)
AST: 21 U/L (ref 0–37)
BILIRUBIN DIRECT: 0.1 mg/dL (ref 0.0–0.3)
BILIRUBIN TOTAL: 0.7 mg/dL (ref 0.2–1.2)
Total Protein: 7.1 g/dL (ref 6.0–8.3)

## 2017-03-01 LAB — TSH: TSH: 2.98 u[IU]/mL (ref 0.35–4.50)

## 2017-03-01 LAB — HEMOGLOBIN A1C: Hgb A1c MFr Bld: 6.1 % (ref 4.6–6.5)

## 2017-03-02 LAB — HIV ANTIBODY (ROUTINE TESTING W REFLEX): HIV 1&2 Ab, 4th Generation: NONREACTIVE

## 2017-03-03 NOTE — Assessment & Plan Note (Signed)

## 2017-03-03 NOTE — Assessment & Plan Note (Signed)
encouraged more activity

## 2017-03-03 NOTE — Assessment & Plan Note (Signed)
stable overall by history and exam, recent data reviewed with pt, and pt to continue medical treatment as before,  to f/u any worsening symptoms or concerns Lab Results  Component Value Date   HGBA1C 6.1 03/01/2017

## 2017-03-03 NOTE — Assessment & Plan Note (Signed)
stable overall by history and exam, recent data reviewed with pt, and pt to continue medical treatment as before,  to f/u any worsening symptoms or concerns Lab Results  Component Value Date   LDLCALC 108 (H) 03/01/2017

## 2017-03-11 ENCOUNTER — Other Ambulatory Visit: Payer: Self-pay | Admitting: Internal Medicine

## 2017-10-02 ENCOUNTER — Encounter: Payer: Self-pay | Admitting: Internal Medicine

## 2017-10-02 ENCOUNTER — Ambulatory Visit: Payer: BC Managed Care – PPO | Admitting: Internal Medicine

## 2017-10-02 VITALS — BP 120/80 | HR 87 | Temp 98.1°F | Ht 70.0 in | Wt 213.0 lb

## 2017-10-02 DIAGNOSIS — R05 Cough: Secondary | ICD-10-CM

## 2017-10-02 DIAGNOSIS — R059 Cough, unspecified: Secondary | ICD-10-CM

## 2017-10-02 DIAGNOSIS — R062 Wheezing: Secondary | ICD-10-CM

## 2017-10-02 MED ORDER — AZITHROMYCIN 250 MG PO TABS: ORAL_TABLET | ORAL | 1 refills | Status: DC

## 2017-10-02 MED ORDER — PREDNISONE 10 MG PO TABS: ORAL_TABLET | ORAL | 0 refills | Status: DC

## 2017-10-02 NOTE — Patient Instructions (Addendum)
Please take all new medication as prescribed  - the antibiotic, and prednisone  You can also take Delsym OTC for cough, and/or Mucinex (or it's generic off brand) for congestion, and tylenol as needed for pain.  Please continue all other medications as before, and refills have been done if requested.  Please have the pharmacy call with any other refills you may need.  Please keep your appointments with your specialists as you may have planned     

## 2017-10-02 NOTE — Assessment & Plan Note (Signed)
Mild to mod, for predpac asd, declines inhaler need,  to f/u any worsening symptoms or concerns

## 2017-10-02 NOTE — Assessment & Plan Note (Signed)
With acute on chronic, Mild to mod, for antibx course,  to f/u any worsening symptoms or concerns, for otc delsym for cough if needed

## 2017-10-02 NOTE — Progress Notes (Signed)
   Subjective:    Patient ID: Adam Roy, male    DOB: 11-23-1975, 42 y.o.   MRN: [id number]  HPI  Here with acute onset mild to mod 2-3 days ST, HA, general weakness and malaise, with prod cough greenish sputum, but Pt denies chest pain, increased sob or doe, wheezing, orthopnea, PND, increased LE swelling, palpitations, dizziness or syncope, except for onset mild wheeze and sob since last pm.  Wife had been ill recently as well, now improved with antibx.   Pt denies polydipsia, polyuria Past Medical History:  Diagnosis Date  . ALLERGIC RHINITIS 04/06/2007  . Anal fissure   . ANXIETY 10/08/2007  . CHF (congestive heart failure) (HCC)   . CROHN'S DISEASE-LARGE & SMALL INTESTINE 1999  . GERD 04/03/2007   w/ LPR  . Hiatal hernia   . Hyperglycemia 02/28/2017  . HYPERLIPIDEMIA 04/09/2008  . IBS 04/03/2007  . Internal hemorrhoids   . Pilonidal cyst 02/16/2011  . Plantar fasciitis    Past Surgical History:  Procedure Laterality Date  . PILONIDAL CYST EXCISION     march 2013    reports that  has never smoked. he has never used smokeless tobacco. He reports that he does not drink alcohol or use drugs. family history includes Heart disease in his father. Allergies  Allergen Reactions  . Fluticasone-Salmeterol   . Ibuprofen    Current Outpatient Medications on File Prior to Visit  Medication Sig Dispense Refill  . pantoprazole (PROTONIX) 40 MG tablet Take 1 tablet (40 mg total) by mouth daily. 90 tablet 3  . simvastatin (ZOCOR) 40 MG tablet TAKE 1 TABLET(40 MG) BY MOUTH AT BEDTIME 90 tablet 3  . simvastatin (ZOCOR) 40 MG tablet TAKE 1 TABLET(40 MG) BY MOUTH AT BEDTIME 90 tablet 3   No current facility-administered medications on file prior to visit.    Review of Systems All otherwise neg per pt     Objective:   Physical Exam BP 120/80   Pulse 87   Temp 98.1 F (36.7 C) (Oral)   Ht 5\' 10"  (1.778 m)   Wt 213 lb (96.6 kg)   SpO2 99%   BMI 30.56 kg/m  VS noted, mild  ill Constitutional: Pt appears in NAD HENT: Head: NCAT.  Right Ear: External ear normal.  Left Ear: External ear normal.  Eyes: . Pupils are equal, round, and reactive to light. Conjunctivae and EOM are normal Nose: without d/c or deformity Neck: Neck supple. Gross normal ROM Cardiovascular: Normal rate and regular rhythm.   Pulmonary/Chest: Effort normal and breath sounds decresaed without rales but with few scattered wheezing.  Neurological: Pt is alert. At baseline orientation, motor grossly intact Skin: Skin is warm. No rashes, other new lesions, no LE edema Psychiatric: Pt behavior is normal without agitation  No other exam findings     Assessment & Plan:

## 2017-10-18 ENCOUNTER — Other Ambulatory Visit: Payer: Self-pay | Admitting: Internal Medicine

## 2017-10-18 MED ORDER — PREDNISONE 10 MG PO TABS: ORAL_TABLET | ORAL | 0 refills | Status: DC

## 2017-10-29 ENCOUNTER — Ambulatory Visit: Payer: BC Managed Care – PPO | Admitting: Internal Medicine

## 2017-10-29 ENCOUNTER — Encounter: Payer: Self-pay | Admitting: Internal Medicine

## 2017-10-29 VITALS — BP 122/84 | HR 95 | Temp 98.4°F | Ht 70.0 in | Wt 213.0 lb

## 2017-10-29 DIAGNOSIS — M545 Low back pain, unspecified: Secondary | ICD-10-CM

## 2017-10-29 MED ORDER — PREDNISONE 10 MG PO TABS: ORAL_TABLET | ORAL | 0 refills | Status: DC

## 2017-10-29 MED ORDER — TRAMADOL HCL 50 MG PO TABS: 50.0000 mg | ORAL_TABLET | Freq: Three times a day (TID) | ORAL | 0 refills | Status: AC | PRN

## 2017-10-29 MED ORDER — CYCLOBENZAPRINE HCL 5 MG PO TABS: 5.0000 mg | ORAL_TABLET | Freq: Three times a day (TID) | ORAL | 1 refills | Status: AC | PRN

## 2017-10-29 NOTE — Assessment & Plan Note (Signed)
Mild to mod, no neuro change, for pain control, muscle relaxer prn, and predpac asd,  to f/u any worsening symptoms or concerns

## 2017-10-29 NOTE — Patient Instructions (Signed)
Please take all new medication as prescribed - the pain medication, prednisone, and muscle relaxer if needed  Please continue all other medications as before, and refills have been done if requested.  Please have the pharmacy call with any other refills you may need.  Please keep your appointments with your specialists as you may have planned

## 2017-10-29 NOTE — Progress Notes (Signed)
Subjective:    Patient ID: Adam Roy, male    DOB: 02/12/1976, 42 y.o.   MRN: [id number]  HPI  Here with 2-3 weeks acute onset mod to severe occas intermittent feeling actually better again, primarily left sided lumbar and radiation to the left thigh/lateral leg; with some change in gait when pain is worse, but bowel or bladder change, fever, wt loss,  worsening distal LE pain/numbness/weakness, or falls.  Had course of prednisone remotely that helped, and low dose short course prednisone on ly helped somewhat.  Denies urinary symptoms such as dysuria, frequency, urgency, flank pain, hematuria or n/v, fever, chills. [place] had surgury there, so pain intermittent despite trying to stand more at work and home.  Pt denies fever, wt loss, night sweats, loss of appetite, or other constitutional symptoms Past Medical History:  Diagnosis Date  . ALLERGIC RHINITIS 04/06/2007  . Anal fissure   . ANXIETY 10/08/2007  . CHF (congestive heart failure) (HCC)   . CROHN'S DISEASE-LARGE & SMALL INTESTINE 1999  . GERD 04/03/2007   w/ LPR  . Hiatal hernia   . Hyperglycemia 02/28/2017  . HYPERLIPIDEMIA 04/09/2008  . IBS 04/03/2007  . Internal hemorrhoids   . Pilonidal cyst 02/16/2011  . Plantar fasciitis    Past Surgical History:  Procedure Laterality Date  . PILONIDAL CYST EXCISION     march 2013    reports that  has never smoked. he has never used smokeless tobacco. He reports that he does not drink alcohol or use drugs. family history includes Heart disease in his father. Allergies  Allergen Reactions  . Fluticasone-Salmeterol   . Ibuprofen    Current Outpatient Medications on File Prior to Visit  Medication Sig Dispense Refill  . pantoprazole (PROTONIX) 40 MG tablet Take 1 tablet (40 mg total) by mouth daily. 90 tablet 3  . simvastatin (ZOCOR) 40 MG tablet TAKE 1 TABLET(40 MG) BY MOUTH AT BEDTIME 90 tablet 3   No current facility-administered medications on  file prior to visit.    Review of Systems  Constitutional: Negative for other unusual diaphoresis or sweats HENT: Negative for ear discharge or swelling Eyes: Negative for other worsening visual disturbances Respiratory: Negative for stridor or other swelling  Gastrointestinal: Negative for worsening distension or other blood Genitourinary: Negative for retention or other urinary change Musculoskeletal: Negative for other MSK pain or swelling Skin: Negative for color change or other new lesions Neurological: Negative for worsening tremors and other numbness  Psychiatric/Behavioral: Negative for worsening agitation or other fatigue All other system neg per pt    Objective:   Physical Exam BP 122/84   Pulse 95   Temp 98.4 F (36.9 C) (Oral)   Ht 5\' 10"  (1.778 m)   Wt 213 lb (96.6 kg)   SpO2 99%   BMI 30.56 kg/m  VS noted,  Constitutional: Pt appears in NAD HENT: Head: NCAT.  Right Ear: External ear normal.  Left Ear: External ear normal.  Eyes: . Pupils are equal, round, and reactive to light. Conjunctivae and EOM are normal Nose: without d/c or deformity Neck: Neck supple. Gross normal ROM Cardiovascular: Normal rate and regular rhythm.   Pulmonary/Chest: Effort normal and breath sounds without rales or wheezing.  Abd:  Soft, NT, ND, + BS, no organomegaly Neurological: Pt is alert. At baseline orientation, motor grossly intact Skin: Skin is warm. No rashes, other new lesions, no LE edema Psychiatric: Pt behavior is normal without agitation  + marked  left lumbar paravertebral tender spasm, spine nontender in midline, no thoracic spine tender     Assessment & Plan:

## 2017-11-08 ENCOUNTER — Telehealth: Payer: Self-pay

## 2017-11-08 NOTE — Telephone Encounter (Signed)
Key: KeyRico Ala: [id number]  PA Case : [id number]: [id number]   Script approved today via Cover My Meds today.

## 2017-11-09 ENCOUNTER — Telehealth: Payer: Self-pay

## 2017-11-09 NOTE — Telephone Encounter (Signed)
Approved  11/08/17-11/09/18  Determination sent to scan

## 2018-03-06 ENCOUNTER — Encounter: Payer: Self-pay | Admitting: Internal Medicine

## 2018-03-06 ENCOUNTER — Ambulatory Visit (INDEPENDENT_AMBULATORY_CARE_PROVIDER_SITE_OTHER): Payer: BC Managed Care – PPO | Admitting: Internal Medicine

## 2018-03-06 ENCOUNTER — Other Ambulatory Visit (INDEPENDENT_AMBULATORY_CARE_PROVIDER_SITE_OTHER): Payer: BC Managed Care – PPO

## 2018-03-06 VITALS — BP 124/82 | HR 95 | Temp 98.4°F | Ht 70.0 in | Wt 208.0 lb

## 2018-03-06 DIAGNOSIS — R739 Hyperglycemia, unspecified: Secondary | ICD-10-CM

## 2018-03-06 DIAGNOSIS — Z0000 Encounter for general adult medical examination without abnormal findings: Secondary | ICD-10-CM

## 2018-03-06 LAB — CBC WITH DIFFERENTIAL/PLATELET
Basophils Absolute: 0 10*3/uL (ref 0.0–0.1)
Basophils Relative: 0.5 % (ref 0.0–3.0)
EOS PCT: 1.2 % (ref 0.0–5.0)
Eosinophils Absolute: 0.1 10*3/uL (ref 0.0–0.7)
HEMATOCRIT: 45.2 % (ref 39.0–52.0)
Hemoglobin: 15.4 g/dL (ref 13.0–17.0)
LYMPHS ABS: 2.5 10*3/uL (ref 0.7–4.0)
Lymphocytes Relative: 38.7 % (ref 12.0–46.0)
MCHC: 33.9 g/dL (ref 30.0–36.0)
MCV: 82.4 fl (ref 78.0–100.0)
MONOS PCT: 5.9 % (ref 3.0–12.0)
Monocytes Absolute: 0.4 10*3/uL (ref 0.1–1.0)
NEUTROS ABS: 3.5 10*3/uL (ref 1.4–7.7)
NEUTROS PCT: 53.7 % (ref 43.0–77.0)
PLATELETS: 268 10*3/uL (ref 150.0–400.0)
RBC: 5.49 Mil/uL (ref 4.22–5.81)
RDW: 13.3 % (ref 11.5–15.5)
WBC: 6.5 10*3/uL (ref 4.0–10.5)

## 2018-03-06 LAB — URINALYSIS, ROUTINE W REFLEX MICROSCOPIC
Bilirubin Urine: NEGATIVE
Ketones, ur: NEGATIVE
Leukocytes, UA: NEGATIVE
Nitrite: NEGATIVE
SPECIFIC GRAVITY, URINE: 1.015 (ref 1.000–1.030)
TOTAL PROTEIN, URINE-UPE24: NEGATIVE
URINE GLUCOSE: NEGATIVE
Urobilinogen, UA: 0.2 (ref 0.0–1.0)
WBC, UA: NONE SEEN (ref 0–?)
pH: 6.5 (ref 5.0–8.0)

## 2018-03-06 LAB — BASIC METABOLIC PANEL
BUN: 12 mg/dL (ref 6–23)
CALCIUM: 9.8 mg/dL (ref 8.4–10.5)
CO2: 30 meq/L (ref 19–32)
Chloride: 102 mEq/L (ref 96–112)
Creatinine, Ser: 0.92 mg/dL (ref 0.40–1.50)
GFR: 96.39 mL/min (ref >60.00)
Glucose, Bld: 112 mg/dL — ABNORMAL HIGH (ref 70–99)
Potassium: 4.4 mEq/L (ref 3.5–5.1)
SODIUM: 140 meq/L (ref 135–145)

## 2018-03-06 LAB — HEPATIC FUNCTION PANEL
ALT: 29 U/L (ref 0–53)
AST: 20 U/L (ref 0–37)
Albumin: 4.6 g/dL (ref 3.5–5.2)
Alkaline Phosphatase: 70 U/L (ref 39–117)
Bilirubin, Direct: 0.1 mg/dL (ref 0.0–0.3)
TOTAL PROTEIN: 7.7 g/dL (ref 6.0–8.3)
Total Bilirubin: 0.7 mg/dL (ref 0.2–1.2)

## 2018-03-06 LAB — PSA: PSA: 1.84 ng/mL (ref 0.10–4.00)

## 2018-03-06 LAB — LIPID PANEL
CHOL/HDL RATIO: 5
CHOLESTEROL: 195 mg/dL (ref 0–200)
HDL: 43.4 mg/dL (ref >39.00)
LDL Cholesterol: 120 mg/dL — ABNORMAL HIGH (ref 0–99)
NonHDL: 151.98
Triglycerides: 161 mg/dL — ABNORMAL HIGH (ref 0.0–149.0)
VLDL: 32.2 mg/dL (ref 0.0–40.0)

## 2018-03-06 LAB — HEMOGLOBIN A1C: Hgb A1c MFr Bld: 6.3 % (ref 4.6–6.5)

## 2018-03-06 LAB — TSH: TSH: 1.88 u[IU]/mL (ref 0.35–4.50)

## 2018-03-06 MED ORDER — PANTOPRAZOLE SODIUM 40 MG PO TBEC: 40.0000 mg | DELAYED_RELEASE_TABLET | Freq: Every day | ORAL | 3 refills | Status: AC

## 2018-03-06 MED ORDER — SIMVASTATIN 40 MG PO TABS: ORAL_TABLET | ORAL | 3 refills | Status: DC

## 2018-03-06 MED ORDER — SIMVASTATIN 40 MG PO TABS: ORAL_TABLET | ORAL | 3 refills | Status: AC

## 2018-03-06 NOTE — Assessment & Plan Note (Signed)

## 2018-03-06 NOTE — Progress Notes (Signed)
Subjective:    Patient ID: Adam Roy, male    DOB: [birth year], 42 y.o.   MRN: [id number]  HPI  Here for wellness and f/u;  Overall doing ok;  Pt denies Chest pain, worsening SOB, DOE, wheezing, orthopnea, PND, worsening LE edema, palpitations, dizziness or syncope.  Pt denies neurological change such as new headache, facial or extremity weakness.  Pt denies polydipsia, polyuria, or low sugar symptoms. Pt states overall good compliance with treatment and medications, good tolerability, and has been trying to follow appropriate diet.  Pt denies worsening depressive symptoms, suicidal ideation or panic. No fever, night sweats, wt loss, loss of appetite, or other constitutional symptoms.  Pt states good ability with ADL's, has low fall risk, home safety reviewed and adequate, no other significant changes in hearing or vision, and only occasionally active with exercise.  No new complaints Wt Readings from Last 3 Encounters:  03/06/18 208 lb (94.3 kg)  10/29/17 213 lb (96.6 kg)  10/02/17 213 lb (96.6 kg)   Past Medical History:  Diagnosis Date  . ALLERGIC RHINITIS 04/06/2007  . Anal fissure   . ANXIETY 10/08/2007  . CHF (congestive heart failure) (HCC)   . CROHN'S DISEASE-LARGE & SMALL INTESTINE 1999  . GERD 04/03/2007   w/ LPR  . Hiatal hernia   . Hyperglycemia 02/28/2017  . HYPERLIPIDEMIA 04/09/2008  . IBS 04/03/2007  . Internal hemorrhoids   . Pilonidal cyst 02/16/2011  . Plantar fasciitis    Past Surgical History:  Procedure Laterality Date  . PILONIDAL CYST EXCISION     march 2013    reports that he has never smoked. He has never used smokeless tobacco. He reports that he does not drink alcohol or use drugs. family history includes Heart disease in his father. Allergies  Allergen Reactions  . Fluticasone-Salmeterol   . Ibuprofen    Current Outpatient Medications on File Prior to Visit  Medication Sig Dispense Refill  . cyclobenzaprine (FLEXERIL) 5 MG tablet Take 1 tablet (5 mg  total) by mouth 3 (three) times daily as needed for muscle spasms. 40 tablet 1  . traMADol (ULTRAM) 50 MG tablet Take 1 tablet (50 mg total) by mouth every 8 (eight) hours as needed. 40 tablet 0   No current facility-administered medications on file prior to visit.    Review of Systems Constitutional: Negative for other unusual diaphoresis, sweats, appetite or weight changes HENT: Negative for other worsening hearing loss, ear pain, facial swelling, mouth sores or neck stiffness.   Eyes: Negative for other worsening pain, redness or other visual disturbance.  Respiratory: Negative for other stridor or swelling Cardiovascular: Negative for other palpitations or other chest pain  Gastrointestinal: Negative for worsening diarrhea or loose stools, blood in stool, distention or other pain Genitourinary: Negative for hematuria, flank pain or other change in urine volume.  Musculoskeletal: Negative for myalgias or other joint swelling.  Skin: Negative for other color change, or other wound or worsening drainage.  Neurological: Negative for other syncope or numbness. Hematological: Negative for other adenopathy or swelling Psychiatric/Behavioral: Negative for hallucinations, other worsening agitation, SI, self-injury, or new decreased concentration All other system neg per pt    Objective:   Physical Exam BP 124/82   Pulse 95   Temp 98.4 F (36.9 C) (Oral)   Ht 5\' 10"  (1.778 m)   Wt 208 lb (94.3 kg)   SpO2 100%   BMI 29.84 kg/m  VS noted,  Constitutional: Pt is oriented to person, place, and time.  Appears well-developed and well-nourished, in no significant distress and comfortable Head: Normocephalic and atraumatic  Eyes: Conjunctivae and EOM are normal. Pupils are equal, round, and reactive to light Right Ear: External ear normal without discharge Left Ear: External ear normal without discharge Nose: Nose without discharge or deformity Mouth/Throat: Oropharynx is without other  ulcerations and moist  Neck: Normal range of motion. Neck supple. No JVD present. No tracheal deviation present or significant neck LA or mass Cardiovascular: Normal rate, regular rhythm, normal heart sounds and intact distal pulses.   Pulmonary/Chest: WOB normal and breath sounds without rales or wheezing  Abdominal: Soft. Bowel sounds are normal. NT. No HSM  Musculoskeletal: Normal range of motion. Exhibits no edema Lymphadenopathy: Has no other cervical adenopathy.  Neurological: Pt is alert and oriented to person, place, and time. Pt has normal reflexes. No cranial nerve deficit. Motor grossly intact, Gait intact Skin: Skin is warm and dry. No rash noted or new ulcerations Psychiatric:  Has normal mood and affect. Behavior is normal without agitation No other exam findings Lab Results  Component Value Date   WBC 6.5 03/06/2018   HGB 15.4 03/06/2018   HCT 45.2 03/06/2018   PLT 268.0 03/06/2018   GLUCOSE 112 (H) 03/06/2018   CHOL 195 03/06/2018   TRIG 161.0 (H) 03/06/2018   HDL 43.40 03/06/2018   LDLDIRECT 121.7 02/14/2011   LDLCALC 120 (H) 03/06/2018   ALT 29 03/06/2018   AST 20 03/06/2018   NA 140 03/06/2018   K 4.4 03/06/2018   CL 102 03/06/2018   CREATININE 0.92 03/06/2018   BUN 12 03/06/2018   CO2 30 03/06/2018   TSH 1.88 03/06/2018   PSA 1.84 03/06/2018   HGBA1C 6.3 03/06/2018       Assessment & Plan:

## 2018-03-06 NOTE — Assessment & Plan Note (Signed)
Also for a1c with lab 

## 2018-03-06 NOTE — Patient Instructions (Signed)

## 2018-05-24 ENCOUNTER — Encounter: Payer: Self-pay | Admitting: Internal Medicine

## 2018-05-24 ENCOUNTER — Ambulatory Visit: Payer: BC Managed Care – PPO | Admitting: Internal Medicine

## 2018-05-24 VITALS — BP 118/82 | HR 106 | Temp 98.9°F | Ht 70.0 in | Wt 205.0 lb

## 2018-05-24 DIAGNOSIS — F411 Generalized anxiety disorder: Secondary | ICD-10-CM

## 2018-05-24 DIAGNOSIS — J309 Allergic rhinitis, unspecified: Secondary | ICD-10-CM

## 2018-05-24 DIAGNOSIS — R739 Hyperglycemia, unspecified: Secondary | ICD-10-CM

## 2018-05-24 MED ORDER — TRIAMCINOLONE ACETONIDE 55 MCG/ACT NA AERO: 2.0000 | INHALATION_SPRAY | Freq: Every day | NASAL | 12 refills | Status: AC

## 2018-05-24 MED ORDER — METHYLPREDNISOLONE ACETATE 80 MG/ML IJ SUSP
80.0000 mg | Freq: Once | INTRAMUSCULAR | Status: AC
  Administered 2018-05-24: 80 mg via INTRAMUSCULAR

## 2018-05-24 MED ORDER — PREDNISONE 10 MG PO TABS: ORAL_TABLET | ORAL | 0 refills | Status: AC

## 2018-05-24 MED ORDER — FEXOFENADINE HCL 180 MG PO TABS: 180.0000 mg | ORAL_TABLET | Freq: Every day | ORAL | 5 refills | Status: AC

## 2018-05-24 NOTE — Progress Notes (Signed)
Subjective:    Patient ID: Adam Roy, male    DOB: [birth year], 43 y.o.   MRN: [id number]  HPI  Here with 1 mo onset sinus congestion, marked cough worse at night, now severe ST as has been going on so long, right ear pain and pressure with radiation to the right side of neck;  Has been better with zyrtec but makes him sleepy, cant take every day.  Has been better off and on with nasacort, just does not seem to work every time.  No fever or colored drainage. Increased protonix to bid has not helped  Did also have a right lower back molar finally removed after many expensive attempts to save it, assoc with some mild swelling that persists and transitory numbness to the right face now resolved (has questioned dental and they done think it was procedure related).  No pain such as trigeimnal neuralgia, other HA, and Pt denies chest pain, increased sob or doe, wheezing, orthopnea, PND, increased LE swelling, palpitations, dizziness or syncope.  Pt denies new neurological symptoms such as new headache, or facial or extremity weakness or numbness other than above.   Pt denies polydipsia, polyuria.  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 ALLERGIC RHINITIS 04/06/2007  . Anal fissure   . ANXIETY 10/08/2007  . CHF (congestive heart failure) (HCC)   . CROHN'S DISEASE-LARGE & SMALL INTESTINE 1999  . GERD 04/03/2007   w/ LPR  . Hiatal hernia   . Hyperglycemia 02/28/2017  . HYPERLIPIDEMIA 04/09/2008  . IBS 04/03/2007  . Internal hemorrhoids   . Pilonidal cyst 02/16/2011  . Plantar fasciitis    Past Surgical History:  Procedure Laterality Date  . PILONIDAL CYST EXCISION     march 2013    reports that he has  never smoked. He has never used smokeless tobacco. He reports that he does not drink alcohol or use drugs. family history includes Heart disease in his father. Allergies  Allergen Reactions  . Fluticasone-Salmeterol   . Ibuprofen    Current Outpatient Medications on File Prior to Visit  Medication Sig Dispense Refill  . cyclobenzaprine (FLEXERIL) 5 MG tablet Take 1 tablet (5 mg total) by mouth 3 (three) times daily as needed for muscle spasms. 40 tablet 1  . pantoprazole (PROTONIX) 40 MG tablet Take 1 tablet (40 mg total) by mouth daily. 90 tablet 3  . simvastatin (ZOCOR) 40 MG tablet TAKE 1 TABLET(40 MG) BY MOUTH AT BEDTIME 90 tablet 3  . traMADol (ULTRAM) 50 MG tablet Take 1 tablet (50 mg total) by mouth every 8 (eight) hours as needed. 40 tablet 0   No current facility-administered medications on file prior to visit.    Review of Systems  Constitutional: Negative for other unusual diaphoresis or sweats HENT: Negative for ear discharge or swelling Eyes: Negative for other worsening visual disturbances Respiratory: Negative for stridor or other swelling  Gastrointestinal: Negative for worsening distension or other blood Genitourinary: Negative for retention or other urinary change Musculoskeletal: Negative for other MSK pain or swelling Skin: Negative for color change or other new lesions Neurological: Negative for worsening tremors and other numbness  Psychiatric/Behavioral: Negative for worsening agitation or other fatigue All other system  neg per pt    Objective:   Physical Exam BP 118/82   Pulse (!) 106   Temp 98.9 F (37.2 C) (Oral)   Ht 5\' 10"  (1.778 m)   Wt 205 lb (93 kg)   SpO2 95%   BMI 29.41 kg/m  VS noted, not ill appearing, nontoxic Constitutional: Pt appears in NAD HENT: Head: NCAT.  Right Ear: External ear normal.  Left Ear: External ear normal.  Bilat tm's with mild erythema left > right.  Max sinus areas non tender.  Pharynx with mod to severe erythema,  no exudate, and no neck or other lymphadenitis Non discrete lower right facial swelling nontender noted 2 days s/p molar extraction Molar extraction site appears with usual granular tissue, no swelling or drainage Eyes: . Pupils are equal, round, and reactive to light. Conjunctivae and EOM are normal Nose: without d/c or deformity Neck: Neck supple. Gross normal ROM Cardiovascular: Normal rate and regular rhythm.   Pulmonary/Chest: Effort normal and breath sounds without rales or wheezing.  Neurological: Pt is alert. At baseline orientation, motor grossly intact, cn 2-12 intact Skin: Skin is warm. No rashes, other new lesions, no LE edema Psychiatric: Pt behavior is normal without agitation  No other exam findings Lab Results  Component Value Date   WBC 6.5 03/06/2018   HGB 15.4 03/06/2018   HCT 45.2 03/06/2018   PLT 268.0 03/06/2018   GLUCOSE 112 (H) 03/06/2018   CHOL 195 03/06/2018   TRIG 161.0 (H) 03/06/2018   HDL 43.40 03/06/2018   LDLDIRECT 121.7 02/14/2011   LDLCALC 120 (H) 03/06/2018   ALT 29 03/06/2018   AST 20 03/06/2018   NA 140 03/06/2018   K 4.4 03/06/2018   CL 102 03/06/2018   CREATININE 0.92 03/06/2018   BUN 12 03/06/2018   CO2 30 03/06/2018   TSH 1.88 03/06/2018   PSA 1.84 03/06/2018   HGBA1C 6.3 03/06/2018       Assessment & Plan:

## 2018-05-24 NOTE — Patient Instructions (Signed)
You had the steroid shot today  Please take all new medication as prescribed - the prednisone, allegra, and nasacort  You can also take Mucinex (or it's generic off brand) for congestion, and tylenol as needed for pain.  Please continue all other medications as before, and refills have been done if requested.  Please have the pharmacy call with any other refills you may need.  Please keep your appointments with your specialists as you may have planned  Good Luck with your new job at [employer]!

## 2018-05-24 NOTE — Assessment & Plan Note (Signed)
stable overall by history and exam, recent data reviewed with pt, and pt to continue medical treatment as before,  to f/u any worsening symptoms or concerns  

## 2018-05-24 NOTE — Assessment & Plan Note (Signed)
Moderate to severe uncontrolled assoc with marked ST, frequent non prod cough, right ear pain and probable right eustachian tube dysfxn symptoms;  For depomedrol 80 IM, predpac asd, allegra asd, daily nasacort, and mucinex otc prn

## 2018-05-25 ENCOUNTER — Other Ambulatory Visit: Payer: Self-pay | Admitting: Internal Medicine

## 2018-06-03 ENCOUNTER — Encounter: Payer: Self-pay | Admitting: Internal Medicine

## 2018-06-03 DIAGNOSIS — J3489 Other specified disorders of nose and nasal sinuses: Secondary | ICD-10-CM
